# Patient Record
Sex: Male | Born: 1975 | Race: White | Hispanic: No | Marital: Single | Smoking: Current some day smoker
Health system: Southern US, Community
[De-identification: ages and names within clinical notes are randomized; demographics above are authoritative.]

## PROBLEM LIST (undated history)

## (undated) DIAGNOSIS — Z87442 Personal history of urinary calculi: Secondary | ICD-10-CM

## (undated) DIAGNOSIS — N135 Crossing vessel and stricture of ureter without hydronephrosis: Secondary | ICD-10-CM

## (undated) DIAGNOSIS — K219 Gastro-esophageal reflux disease without esophagitis: Secondary | ICD-10-CM

## (undated) DIAGNOSIS — N200 Calculus of kidney: Secondary | ICD-10-CM

## (undated) HISTORY — PX: HERNIA REPAIR: SHX51

---

## 2014-02-25 ENCOUNTER — Encounter (HOSPITAL_COMMUNITY): Payer: Self-pay | Admitting: Emergency Medicine

## 2014-02-25 ENCOUNTER — Emergency Department (HOSPITAL_COMMUNITY): Payer: Self-pay

## 2014-02-25 ENCOUNTER — Emergency Department (HOSPITAL_COMMUNITY)
Admission: EM | Admit: 2014-02-25 | Discharge: 2014-02-25 | Disposition: A | Discharge status: Home / Self Care | Payer: Self-pay | Attending: Emergency Medicine | Admitting: Emergency Medicine

## 2014-02-25 DIAGNOSIS — N200 Calculus of kidney: Secondary | ICD-10-CM | POA: Insufficient documentation

## 2014-02-25 DIAGNOSIS — Z9889 Other specified postprocedural states: Secondary | ICD-10-CM | POA: Insufficient documentation

## 2014-02-25 DIAGNOSIS — F17200 Nicotine dependence, unspecified, uncomplicated: Secondary | ICD-10-CM | POA: Insufficient documentation

## 2014-02-25 LAB — CBC WITH DIFFERENTIAL/PLATELET
BASOS PCT: 0 % (ref 0–1)
Basophils Absolute: 0.1 10*3/uL (ref 0.0–0.1)
Eosinophils Absolute: 0.2 10*3/uL (ref 0.0–0.7)
Eosinophils Relative: 1 % (ref 0–5)
HEMATOCRIT: 42.3 % (ref 39.0–52.0)
Hemoglobin: 15.1 g/dL (ref 13.0–17.0)
LYMPHS PCT: 12 % (ref 12–46)
Lymphs Abs: 1.9 10*3/uL (ref 0.7–4.0)
MCH: 31.5 pg (ref 26.0–34.0)
MCHC: 35.7 g/dL (ref 30.0–36.0)
MCV: 88.3 fL (ref 78.0–100.0)
MONO ABS: 0.9 10*3/uL (ref 0.1–1.0)
Monocytes Relative: 5 % (ref 3–12)
Neutro Abs: 13.5 10*3/uL — ABNORMAL HIGH (ref 1.7–7.7)
Neutrophils Relative %: 82 % — ABNORMAL HIGH (ref 43–77)
PLATELETS: 266 10*3/uL (ref 150–400)
RBC: 4.79 MIL/uL (ref 4.22–5.81)
RDW: 12.8 % (ref 11.5–15.5)
WBC: 16.6 10*3/uL — AB (ref 4.0–10.5)

## 2014-02-25 LAB — BASIC METABOLIC PANEL
BUN: 10 mg/dL (ref 6–23)
CALCIUM: 10 mg/dL (ref 8.4–10.5)
CO2: 23 mEq/L (ref 19–32)
CREATININE: 0.98 mg/dL (ref 0.50–1.35)
Chloride: 99 mEq/L (ref 96–112)
GFR calc non Af Amer: >90 mL/min (ref >90)
Glucose, Bld: 153 mg/dL — ABNORMAL HIGH (ref 70–99)
Potassium: 3.5 mEq/L — ABNORMAL LOW (ref 3.7–5.3)
SODIUM: 142 meq/L (ref 137–147)

## 2014-02-25 LAB — URINALYSIS, ROUTINE W REFLEX MICROSCOPIC
GLUCOSE, UA: NEGATIVE mg/dL
KETONES UR: NEGATIVE mg/dL
Nitrite: NEGATIVE
PROTEIN: 100 mg/dL — AB
Specific Gravity, Urine: 1.023 (ref 1.005–1.030)
UROBILINOGEN UA: 0.2 mg/dL (ref 0.0–1.0)
pH: 6 (ref 5.0–8.0)

## 2014-02-25 LAB — URINE MICROSCOPIC-ADD ON

## 2014-02-25 MED ORDER — HYDROMORPHONE HCL PF 1 MG/ML IJ SOLN
1.0000 mg | Freq: Once | INTRAMUSCULAR | Status: DC
  Filled 2014-02-25: qty 1

## 2014-02-25 MED ORDER — ONDANSETRON 8 MG PO TBDP: 8.0000 mg | ORAL_TABLET | Freq: Three times a day (TID) | ORAL | Status: DC | PRN

## 2014-02-25 MED ORDER — OXYCODONE-ACETAMINOPHEN 5-325 MG PO TABS: 2.0000 | ORAL_TABLET | ORAL | Status: DC | PRN

## 2014-02-25 MED ORDER — TAMSULOSIN HCL 0.4 MG PO CAPS: 0.4000 mg | ORAL_CAPSULE | Freq: Every day | ORAL | Status: DC

## 2014-02-25 MED ORDER — ONDANSETRON HCL 4 MG/2ML IJ SOLN
4.0000 mg | Freq: Once | INTRAMUSCULAR | Status: DC
  Filled 2014-02-25: qty 2

## 2014-02-25 NOTE — Discharge Instructions (Signed)
You have a large (1.0 cm) stone at the junction of your kidney and your ureter.  This will most likely require urology evaluation and treatment to pass.  You have a 3 mm stone still in the kidney, not causing problems at this time.    Take medications as prescribed.  Drink plenty of water.  Follow up with urology.  [facility] if you are having worsening condition or new concerning symptoms.  The urologist prefer to [facility] their patients [facility].     Kidney Stones Kidney stones (urolithiasis) are deposits that form inside your kidneys. The intense pain is caused by the stone moving through the urinary tract. When the stone moves, the ureter goes into spasm around the stone. The stone is usually passed in the urine.  CAUSES   A disorder that makes certain neck glands produce too much parathyroid hormone (primary hyperparathyroidism).  A buildup of uric acid crystals, similar to gout in your joints.  Narrowing (stricture) of the ureter.  A kidney obstruction present at birth (congenital obstruction).  Previous surgery on the kidney or ureters.  Numerous kidney infections. SYMPTOMS   Feeling sick to your stomach (nauseous).  Throwing up (vomiting).  Blood in the urine (hematuria).  Pain that usually spreads (radiates) to the groin.  Frequency or urgency of urination. DIAGNOSIS   Taking a history and physical exam.  Blood or urine tests.  CT scan.  Occasionally, an examination of the inside of the urinary bladder (cystoscopy) is performed. TREATMENT   Observation.  Increasing your fluid intake.  Extracorporeal shock wave lithotripsy This is a noninvasive procedure that uses shock waves to break up kidney stones.  Surgery may be needed if you have severe pain or persistent obstruction. There are various surgical procedures. Most of the procedures are performed with the use of small instruments. Only small incisions are needed to accommodate these  instruments, so recovery time is minimized. The size, location, and chemical composition are all important variables that will determine the proper choice of action for you. Talk to your health care provider to better understand your situation so that you will minimize the risk of injury to yourself and your kidney.  HOME CARE INSTRUCTIONS   Drink enough water and fluids to keep your urine clear or pale yellow. This will help you to pass the stone or stone fragments.  Strain all urine through the provided strainer. Keep all particulate matter and stones for your health care provider to see. The stone causing the pain may be as small as a grain of salt. It is very important to use the strainer each and every time you pass your urine. The collection of your stone will allow your health care provider to analyze it and verify that a stone has actually passed. The stone analysis will often identify what you can do to reduce the incidence of recurrences.  Only take over-the-counter or prescription medicines for pain, discomfort, or fever as directed by your health care provider.  Make a follow-up appointment with your health care provider as directed.  Get follow-up X-rays if required. The absence of pain does not always mean that the stone has passed. It may have only stopped moving. If the urine remains completely obstructed, it can cause loss of kidney function or even complete destruction of the kidney. It is your responsibility to make sure X-rays and follow-ups are completed. Ultrasounds of the kidney can show blockages and the status of the kidney. Ultrasounds  are not associated with any radiation and can be performed easily in a matter of minutes. SEEK MEDICAL CARE IF:  You experience pain that is progressive and unresponsive to any pain medicine you have been prescribed. SEEK IMMEDIATE MEDICAL CARE IF:   Pain cannot be controlled with the prescribed medicine.  You have a fever or shaking  chills.  The severity or intensity of pain increases over 18 hours and is not relieved by pain medicine.  You develop a new onset of abdominal pain.  You feel faint or pass out.  You are unable to urinate. MAKE SURE YOU:   Understand these instructions.  Will watch your condition.  Will get help right away if you are not doing well or get worse. Document Released: 10/15/2005 Document Revised: 06/17/2013 Document Reviewed: 03/18/2013 [facility] Patient Information 2014 [place].

## 2014-02-25 NOTE — ED Notes (Signed)
Pt is c/o right flank pain that initially started about 3 to 4 days ago and got worse tonight about 2am  Pt has had nausea and vomiting tonight

## 2014-02-25 NOTE — ED Provider Notes (Signed)
CSN: [id number]     Arrival date & time 02/25/14  0414 History   First MD Initiated Contact with Patient 02/25/14 [phone]     Chief Complaint  Patient presents with  . Flank Pain     (Consider location/radiation/quality/duration/timing/severity/associated sxs/prior Treatment) HPI 38 year old male presents to emergency room from home with complaint of right flank pain.  Patient reports onset of pain 3-4 days ago, tonight with severe worsening of pain with radiation down around his abdomen into his groin.  Patient has had nausea and vomiting with the pain.  Pain is sharp stabbing in nature severe.  Patient reports remote history of kidney stone 15 years ago, reports symptoms are similar to prior stone.  No fevers no chills.  Patient has history of hernia repair Past Medical History  Diagnosis Date  . Kidney stone    Past Surgical History  Procedure Laterality Date  . Hernia repair     Family History  Problem Relation Age of Onset  . Kidney Stones Mother   . Kidney Stones Father   . Diabetes Father   . Hypertension Father    History  Substance Use Topics  . Smoking status: Current Some Day Smoker    Types: Cigarettes  . Smokeless tobacco: Not on file  . Alcohol Use: No    Review of Systems  See History of Present Illness; otherwise all other systems are reviewed and negative   Allergies  Review of patient's allergies indicates no known allergies.  Home Medications   Prior to Admission medications   Medication Sig Start Date End Date Taking? Authorizing Provider  naproxen sodium (ANAPROX) 220 MG tablet Take 440 mg by mouth 2 (two) times daily as needed (pain).   Yes Historical Provider, MD   BP 129/81  Pulse 62  Temp(Src) 97.6 F (36.4 C) (Oral)  Resp 19  Ht 5\' 9"  (1.753 m)  Wt 180 lb (81.647 kg)  BMI 26.57 kg/m2  SpO2 100% Physical Exam  Nursing note and vitals reviewed. Constitutional: He is oriented to person, place, and time. He appears well-developed and  well-nourished. He appears distressed.  HENT:  Head: Normocephalic and atraumatic.  Nose: Nose normal.  Mouth/Throat: Oropharynx is clear and moist.  Eyes: Conjunctivae and EOM are normal. Pupils are equal, round, and reactive to light.  Neck: Normal range of motion. Neck supple. No JVD present. No tracheal deviation present. No thyromegaly present.  Cardiovascular: Normal rate, regular rhythm, normal heart sounds and intact distal pulses.  Exam reveals no gallop and no friction rub.   No murmur heard. Pulmonary/Chest: Effort normal and breath sounds normal. No stridor. No respiratory distress. He has no wheezes. He has no rales. He exhibits no tenderness.  Abdominal: Soft. Bowel sounds are normal. He exhibits no distension and no mass. There is tenderness (right CVA tenderness). There is no rebound and no guarding.  Musculoskeletal: Normal range of motion. He exhibits no edema and no tenderness.  Lymphadenopathy:    He has no cervical adenopathy.  Neurological: He is alert and oriented to person, place, and time. He exhibits normal muscle tone. Coordination normal.  Skin: Skin is warm and dry. No rash noted. No erythema. No pallor.  Psychiatric: He has a normal mood and affect. His behavior is normal. Judgment and thought content normal.    ED Course  Procedures (including critical care time) Labs Review Labs Reviewed  CBC WITH DIFFERENTIAL - Abnormal; Notable for the following:    WBC 16.6 (*)    Neutrophils  Relative % 82 (*)    Neutro Abs 13.5 (*)    All other components within normal limits  BASIC METABOLIC PANEL - Abnormal; Notable for the following:    Potassium 3.5 (*)    Glucose, Bld 153 (*)    All other components within normal limits  URINALYSIS, ROUTINE W REFLEX MICROSCOPIC    Imaging Review Ct Abdomen Pelvis Wo Contrast  02/25/2014   CLINICAL DATA:  Right flank pain  EXAM: CT ABDOMEN AND PELVIS WITHOUT CONTRAST  TECHNIQUE: Multidetector CT imaging of the abdomen and  pelvis was performed following the standard protocol without intravenous contrast.  COMPARISON:  None.  FINDINGS: The visualized lung bases are clear.  The liver demonstrates a normal unenhanced appearance. Gallbladder is within normal limits. No biliary ductal dilatation. The spleen, adrenal glands, and pancreas demonstrate a normal unenhanced appearance.  AA 1 cm partially obstructive stone present at the right UPJ (series 4, image 74). There is fullness of the right renal with trace hydronephrosis in the mid and lower right kidney. Inflammatory fat stranding seen about the proximal right ureter. There is asymmetric prominence of the right ureter distally without additional ureteral stone. There is an additional nonobstructive 3 mm stone within the lower pole of the right kidney.  No evidence of bowel obstruction. Appendix is well visualized in the right lower quadrant and is of normal caliber and appearance without associated inflammatory changes to suggest acute appendicitis. No inflammatory changes seen about the bowels.  Bladder and prostate are normal.  No free air or fluid. No enlarged intra-abdominal pelvic lymph nodes.  No acute osseous abnormality. No worrisome lytic or blastic osseous lesions. Bone island noted within the right femoral neck.  IMPRESSION: 1. 1 cm stone at the right ureteropelvic junction with associated inflammatory fat stranding about the right renal pelvis and proximal right ureter. There is secondary trace hydronephrosis within the mid and lower right kidney. 2. Additional 3 mm nonobstructive stone within the lower pole the right kidney. 3. No other acute intra-abdominal or pelvic process. Normal appendix.   Electronically Signed   By: Rise MuBenjamin  McClintock M.D.   On: 02/25/2014 05:41     EKG Interpretation None      MDM   Final diagnoses:  Kidney stone on right side    38 year old male with right flank pain, suspect renal colic.  Patient to receive IV pain and nausea  medication, plan for CT abdomen pelvis, basic labs.  6:27 AM Pt reports resolution of pain, nausea without medications.  Large stone at UPJ noted on CT scan.  Pt to f/u with urology.   Olivia Mackielga M Zahirah Cheslock, MD 02/25/14 [phone]

## 2014-02-25 NOTE — ED Notes (Signed)
Went in to start IV and give pain medication  Pt states he feels 100% better and the pain is gone  Pt's color has improved  Pt talkative

## 2014-02-25 NOTE — ED Notes (Signed)
Pt complains of generalized abd pain for about one week, tonight he complains of severe right sided abd pain, nausea no vomiting

## 2014-03-02 ENCOUNTER — Other Ambulatory Visit: Payer: Self-pay | Admitting: Urology

## 2014-03-11 ENCOUNTER — Encounter (HOSPITAL_BASED_OUTPATIENT_CLINIC_OR_DEPARTMENT_OTHER): Payer: Self-pay | Admitting: *Deleted

## 2014-03-15 ENCOUNTER — Encounter (HOSPITAL_BASED_OUTPATIENT_CLINIC_OR_DEPARTMENT_OTHER): Payer: Self-pay | Admitting: *Deleted

## 2014-03-15 NOTE — Progress Notes (Signed)
NPO AFTER MN WITH EXCEPTION WATER/ GATORADE UNTIL 0700.  ARRIVE AT 1145. CURRENT LAB RESULTS IN CHART AND EPIC. MAY TAKE PAIN/ NAUSEA RX IF NEEDED AM DOS W/ SIPS OF WATER.

## 2014-03-17 ENCOUNTER — Ambulatory Visit (HOSPITAL_BASED_OUTPATIENT_CLINIC_OR_DEPARTMENT_OTHER): Payer: Self-pay | Admitting: Anesthesiology

## 2014-03-17 ENCOUNTER — Ambulatory Visit (HOSPITAL_BASED_OUTPATIENT_CLINIC_OR_DEPARTMENT_OTHER)
Admission: RE | Admit: 2014-03-17 | Discharge: 2014-03-17 | Disposition: A | Discharge status: Home / Self Care | Payer: Self-pay | Source: Ambulatory Visit | Attending: Urology | Admitting: Urology

## 2014-03-17 ENCOUNTER — Encounter (HOSPITAL_BASED_OUTPATIENT_CLINIC_OR_DEPARTMENT_OTHER): Payer: Self-pay | Admitting: *Deleted

## 2014-03-17 ENCOUNTER — Encounter (HOSPITAL_BASED_OUTPATIENT_CLINIC_OR_DEPARTMENT_OTHER)
Admission: RE | Disposition: A | Discharge status: Home / Self Care | Payer: Self-pay | Source: Ambulatory Visit | Attending: Urology

## 2014-03-17 ENCOUNTER — Encounter (HOSPITAL_BASED_OUTPATIENT_CLINIC_OR_DEPARTMENT_OTHER): Payer: Self-pay | Admitting: Anesthesiology

## 2014-03-17 DIAGNOSIS — N135 Crossing vessel and stricture of ureter without hydronephrosis: Secondary | ICD-10-CM | POA: Insufficient documentation

## 2014-03-17 DIAGNOSIS — K219 Gastro-esophageal reflux disease without esophagitis: Secondary | ICD-10-CM | POA: Insufficient documentation

## 2014-03-17 DIAGNOSIS — N200 Calculus of kidney: Secondary | ICD-10-CM | POA: Insufficient documentation

## 2014-03-17 DIAGNOSIS — F17200 Nicotine dependence, unspecified, uncomplicated: Secondary | ICD-10-CM | POA: Insufficient documentation

## 2014-03-17 HISTORY — DX: Gastro-esophageal reflux disease without esophagitis: K21.9

## 2014-03-17 HISTORY — DX: Calculus of kidney: N20.0

## 2014-03-17 HISTORY — DX: Personal history of urinary calculi: Z87.442

## 2014-03-17 HISTORY — PX: CYSTOSCOPY WITH RETROGRADE PYELOGRAM, URETEROSCOPY AND STENT PLACEMENT: SHX5789

## 2014-03-17 SURGERY — CYSTOURETEROSCOPY, WITH RETROGRADE PYELOGRAM AND STENT INSERTION
Anesthesia: General | Site: Ureter | Laterality: Right

## 2014-03-17 MED ORDER — CEFAZOLIN SODIUM-DEXTROSE 2-3 GM-% IV SOLR
2.0000 g | INTRAVENOUS | Status: AC
  Administered 2014-03-17: 2 g via INTRAVENOUS
  Filled 2014-03-17: qty 50

## 2014-03-17 MED ORDER — LIDOCAINE HCL (CARDIAC) 20 MG/ML IV SOLN
INTRAVENOUS | Status: DC | PRN
  Administered 2014-03-17: 100 mg via INTRAVENOUS

## 2014-03-17 MED ORDER — FENTANYL CITRATE 0.05 MG/ML IJ SOLN
INTRAMUSCULAR | Status: DC | PRN
  Administered 2014-03-17 (×4): 50 ug via INTRAVENOUS

## 2014-03-17 MED ORDER — SODIUM CHLORIDE 0.9 % IR SOLN
Status: DC | PRN
  Administered 2014-03-17: 4000 mL

## 2014-03-17 MED ORDER — METOCLOPRAMIDE HCL 5 MG/ML IJ SOLN
INTRAMUSCULAR | Status: DC | PRN
  Administered 2014-03-17: 10 mg via INTRAVENOUS

## 2014-03-17 MED ORDER — OXYBUTYNIN CHLORIDE 5 MG PO TABS
5.0000 mg | ORAL_TABLET | Freq: Four times a day (QID) | ORAL | Status: DC | PRN
  Administered 2014-03-17: 5 mg via ORAL
  Filled 2014-03-17: qty 1

## 2014-03-17 MED ORDER — CEPHALEXIN 500 MG PO CAPS: 500.0000 mg | ORAL_CAPSULE | Freq: Three times a day (TID) | ORAL | Status: DC

## 2014-03-17 MED ORDER — PHENAZOPYRIDINE HCL 200 MG PO TABS: 200.0000 mg | ORAL_TABLET | Freq: Three times a day (TID) | ORAL | Status: DC | PRN

## 2014-03-17 MED ORDER — PHENAZOPYRIDINE HCL 200 MG PO TABS
200.0000 mg | ORAL_TABLET | Freq: Three times a day (TID) | ORAL | Status: DC
  Administered 2014-03-17: 200 mg via ORAL
  Filled 2014-03-17: qty 1

## 2014-03-17 MED ORDER — KETOROLAC TROMETHAMINE 30 MG/ML IJ SOLN
INTRAMUSCULAR | Status: DC | PRN
  Administered 2014-03-17: 30 mg via INTRAVENOUS

## 2014-03-17 MED ORDER — LACTATED RINGERS IV SOLN
INTRAVENOUS | Status: DC
  Administered 2014-03-17: 12:00:00 via INTRAVENOUS
  Filled 2014-03-17: qty 1000

## 2014-03-17 MED ORDER — OXYCODONE-ACETAMINOPHEN 5-325 MG PO TABS: 1.0000 | ORAL_TABLET | ORAL | Status: DC | PRN

## 2014-03-17 MED ORDER — LIDOCAINE HCL 2 % EX GEL
CUTANEOUS | Status: DC | PRN
  Administered 2014-03-17: 1 via URETHRAL

## 2014-03-17 MED ORDER — ONDANSETRON HCL 4 MG/2ML IJ SOLN
INTRAMUSCULAR | Status: DC | PRN
  Administered 2014-03-17: 4 mg via INTRAVENOUS

## 2014-03-17 MED ORDER — MIDAZOLAM HCL 2 MG/2ML IJ SOLN
INTRAMUSCULAR | Status: AC
  Filled 2014-03-17: qty 2

## 2014-03-17 MED ORDER — PHENAZOPYRIDINE HCL 100 MG PO TABS
ORAL_TABLET | ORAL | Status: AC
  Filled 2014-03-17: qty 2

## 2014-03-17 MED ORDER — ACETAMINOPHEN 10 MG/ML IV SOLN
INTRAVENOUS | Status: DC | PRN
  Administered 2014-03-17: 1000 mg via INTRAVENOUS

## 2014-03-17 MED ORDER — OXYCODONE-ACETAMINOPHEN 5-325 MG PO TABS
ORAL_TABLET | ORAL | Status: AC
  Filled 2014-03-17: qty 1

## 2014-03-17 MED ORDER — PROPOFOL 10 MG/ML IV BOLUS
INTRAVENOUS | Status: DC | PRN
  Administered 2014-03-17: 200 mg via INTRAVENOUS

## 2014-03-17 MED ORDER — PROMETHAZINE HCL 25 MG/ML IJ SOLN
6.2500 mg | INTRAMUSCULAR | Status: DC | PRN
  Filled 2014-03-17: qty 1

## 2014-03-17 MED ORDER — OXYCODONE-ACETAMINOPHEN 5-325 MG PO TABS
1.0000 | ORAL_TABLET | ORAL | Status: DC | PRN
  Administered 2014-03-17: 1 via ORAL
  Filled 2014-03-17: qty 2

## 2014-03-17 MED ORDER — SENNOSIDES-DOCUSATE SODIUM 8.6-50 MG PO TABS: 1.0000 | ORAL_TABLET | Freq: Two times a day (BID) | ORAL | Status: DC

## 2014-03-17 MED ORDER — GLYCOPYRROLATE 0.2 MG/ML IJ SOLN
INTRAMUSCULAR | Status: DC | PRN
  Administered 2014-03-17: 0.2 mg via INTRAVENOUS

## 2014-03-17 MED ORDER — OXYBUTYNIN CHLORIDE 5 MG PO TABS
ORAL_TABLET | ORAL | Status: AC
  Filled 2014-03-17: qty 1

## 2014-03-17 MED ORDER — OXYBUTYNIN CHLORIDE 5 MG PO TABS: 5.0000 mg | ORAL_TABLET | Freq: Four times a day (QID) | ORAL | Status: DC | PRN

## 2014-03-17 MED ORDER — BELLADONNA ALKALOIDS-OPIUM 16.2-60 MG RE SUPP
RECTAL | Status: AC
  Filled 2014-03-17: qty 1

## 2014-03-17 MED ORDER — BELLADONNA ALKALOIDS-OPIUM 16.2-60 MG RE SUPP
RECTAL | Status: DC | PRN
  Administered 2014-03-17: 1 via RECTAL

## 2014-03-17 MED ORDER — IOHEXOL 350 MG/ML SOLN
INTRAVENOUS | Status: DC | PRN
  Administered 2014-03-17: 5 mL via URETHRAL

## 2014-03-17 MED ORDER — FENTANYL CITRATE 0.05 MG/ML IJ SOLN
25.0000 ug | INTRAMUSCULAR | Status: DC | PRN
  Filled 2014-03-17: qty 1

## 2014-03-17 MED ORDER — FENTANYL CITRATE 0.05 MG/ML IJ SOLN
INTRAMUSCULAR | Status: AC
  Filled 2014-03-17: qty 4

## 2014-03-17 MED ORDER — DEXAMETHASONE SODIUM PHOSPHATE 4 MG/ML IJ SOLN
INTRAMUSCULAR | Status: DC | PRN
  Administered 2014-03-17: 10 mg via INTRAVENOUS

## 2014-03-17 MED ORDER — MIDAZOLAM HCL 5 MG/5ML IJ SOLN
INTRAMUSCULAR | Status: DC | PRN
  Administered 2014-03-17: 2 mg via INTRAVENOUS

## 2014-03-17 SURGICAL SUPPLY — 41 items
BAG DRAIN URO-CYSTO SKYTR STRL (DRAIN) ×4 IMPLANT
BASKET LASER NITINOL 1.9FR (BASKET) IMPLANT
BASKET STNLS GEMINI 4WIRE 3FR (BASKET) IMPLANT
BASKET ZERO TIP NITINOL 2.4FR (BASKET) IMPLANT
CANISTER SUCT LVC 12 LTR MEDI- (MISCELLANEOUS) ×4 IMPLANT
CATH CLEAR GEL 3F BACKSTOP (CATHETERS) IMPLANT
CATH INTERMIT  6FR 70CM (CATHETERS) IMPLANT
CATH URET 5FR 28IN CONE TIP (BALLOONS)
CATH URET 5FR 28IN OPEN ENDED (CATHETERS) ×4 IMPLANT
CATH URET 5FR 70CM CONE TIP (BALLOONS) IMPLANT
CATH URET DUAL LUMEN 6-10FR 50 (CATHETERS) IMPLANT
CLOTH BEACON ORANGE TIMEOUT ST (SAFETY) ×4 IMPLANT
DRAPE CAMERA CLOSED 9X96 (DRAPES) ×4 IMPLANT
ELECT REM PT RETURN 9FT ADLT (ELECTROSURGICAL)
ELECTRODE REM PT RTRN 9FT ADLT (ELECTROSURGICAL) IMPLANT
FIBER LASER FLEXIVA 200 (UROLOGICAL SUPPLIES) IMPLANT
FIBER LASER FLEXIVA 365 (UROLOGICAL SUPPLIES) IMPLANT
GLOVE BIO SURGEON STRL SZ7 (GLOVE) ×4 IMPLANT
GLOVE BIOGEL M 6.5 STRL (GLOVE) ×4 IMPLANT
GLOVE BIOGEL PI IND STRL 6.5 (GLOVE) ×2 IMPLANT
GLOVE BIOGEL PI INDICATOR 6.5 (GLOVE) ×2
GLOVE ECLIPSE 7.0 STRL STRAW (GLOVE) ×4 IMPLANT
GLOVE INDICATOR 7.5 STRL GRN (GLOVE) ×4 IMPLANT
GOWN PREVENTION PLUS LG XLONG (DISPOSABLE) ×4 IMPLANT
GOWN STRL REUS W/TWL LRG LVL3 (GOWN DISPOSABLE) ×4 IMPLANT
GOWN STRL REUS W/TWL XL LVL3 (GOWN DISPOSABLE) ×4 IMPLANT
GUIDEWIRE 0.038 PTFE COATED (WIRE) IMPLANT
GUIDEWIRE ANG ZIPWIRE 038X150 (WIRE) IMPLANT
GUIDEWIRE STR DUAL SENSOR (WIRE) ×8 IMPLANT
IV NS IRRIG 3000ML ARTHROMATIC (IV SOLUTION) ×4 IMPLANT
KIT BALLIN UROMAX 15FX10 (LABEL) IMPLANT
KIT BALLN UROMAX 15FX4 (MISCELLANEOUS) IMPLANT
KIT BALLN UROMAX 26 75X4 (MISCELLANEOUS)
PACK CYSTOSCOPY (CUSTOM PROCEDURE TRAY) ×4 IMPLANT
SET HIGH PRES BAL DIL (LABEL)
SHEATH ACCESS URETERAL 38CM (SHEATH) IMPLANT
SHEATH ACCESS URETERAL 54CM (SHEATH) ×4 IMPLANT
SHEATH URET ACCESS 12FR/35CM (UROLOGICAL SUPPLIES) IMPLANT
SHEATH URET ACCESS 12FR/55CM (UROLOGICAL SUPPLIES) IMPLANT
STENT POLARIS LOOP 6FR X 24 CM (STENTS) ×4 IMPLANT
SYRINGE IRR TOOMEY STRL 70CC (SYRINGE) IMPLANT

## 2014-03-17 NOTE — Discharge Instructions (Signed)
DISCHARGE INSTRUCTIONS FOR KIDNEY STONES OR URETERAL STENT ° °MEDICATIONS:  ° °1.Resume all your other meds from home. ° °ACTIVITY °1. No strenuous activity x 1week °2. No driving while on narcotic pain medications °3. Drink plenty of water °4. Continue to walk at home - you can still get blood clots when you are at home, so keep active, but don't over do it. °5. May return to work in 3 days. ° °BATHING °1. You can shower or take a bath. °SIGNS/SYMPTOMS TO CALL: °1. Please call us if you have a fever greater than 101.5, uncontrolled  °nausea/vomiting, uncontrolled pain, dizziness, unable to urinate, chest pain, shortness of breath, leg swelling, leg pain, redness around wound, drainage from wound, or any other concerns or questions. ° °You can reach us at [phone]. ° °[facility] °[phone] °Post Ureteroscopy With or Without Stent Instructions ° °Definitions: ° °Ureter: The duct that transports urine from the kidney to the bladder. °Stent:   A plastic hollow tube that is placed into the ureter, from the kidney to the                 bladder to prevent the ureter from swelling shut. ° °GENERAL INSTRUCTIONS: ° °Despite the fact that no skin incisions were used, the area around the ureter and bladder is raw and irritated. The stent is a foreign body which will further irritate the bladder wall. This irritation is manifested by increased frequency of urination, both day and night, and by an increase in the urge to urinate. In some, the urge to urinate is present almost always. Sometimes the urge is strong enough that you may not be able to stop yourself from urinating. The only real cure is to remove the stent and then give time for the bladder wall to heal which can't be done until the danger of the ureter swelling shut has passed, which varies. ° °You may see some blood in your urine while the stent is in place and a few days afterwards. Do not be alarmed, even if the urine was clear for a  while. Get off your feet and drink lots of fluids until clearing occurs. If you start to pass clots or don't improve, call us. ° °DIET: °You may return to your normal diet immediately. Because of the raw surface of your bladder, alcohol, spicy foods, acid type foods and drinks with caffeine may cause irritation or frequency and should be used in moderation. To keep your urine flowing freely and to avoid constipation, drink plenty of fluids during the day ( 8-10 glasses ). °Tip: Avoid cranberry juice because it is very acidic. ° °ACTIVITY: °Your physical activity doesn't need to be restricted. However, if you are very active, you may see some blood in your urine. We suggest that you reduce your activity under these circumstances until the bleeding has stopped. ° °BOWELS: °It is important to keep your bowels regular during the postoperative period. Straining with bowel movements can cause bleeding. A bowel movement every other day is reasonable. Use a mild laxative if needed, such as Milk of Magnesia 2-3 tablespoons, or 2 Dulcolax tablets. Call if you continue to have problems. If you have been taking narcotics for pain, before, during or after your surgery, you may be constipated. Take a laxative if necessary. ° °MEDICATION: °You should resume your pre-surgery medications unless told not to. In addition you will often be given an antibiotic to prevent infection. These should be taken as prescribed until the bottles   are finished unless you are having an unusual reaction to one of the drugs. ° °PROBLEMS YOU SHOULD REPORT TO US: °· Fevers over 100.5 Fahrenheit. °· Heavy bleeding, or clots ( See above notes about blood in urine ). °· Inability to urinate. °· Drug reactions ( hives, rash, nausea, vomiting, diarrhea ). °· Severe burning or pain with urination that is not improving. ° °FOLLOW-UP: °You will need a follow-up appointment to monitor your progress. Call for this appointment at the number listed above. Usually  the first appointment will be about three to fourteen days after your surgery. ° ° °Post Anesthesia Home Care Instructions ° °Activity: °Get plenty of rest for the remainder of the day. A responsible adult should stay with you for 24 hours following the procedure.  °For the next 24 hours, DO NOT: °-Drive a car °-Operate machinery °-Drink alcoholic beverages °-Take any medication unless instructed by your physician °-Make any legal decisions or sign important papers. ° °Meals: °Start with liquid foods such as gelatin or soup. Progress to regular foods as tolerated. Avoid greasy, spicy, heavy foods. If nausea and/or vomiting occur, drink only clear liquids until the nausea and/or vomiting subsides. Call your physician if vomiting continues. ° °Special Instructions/Symptoms: °Your throat may feel dry or sore from the anesthesia or the breathing tube placed in your throat during surgery. If this causes discomfort, gargle with warm salt water. The discomfort should disappear within 24 hours. ° °

## 2014-03-17 NOTE — H&P (Signed)
Urology History and Physical Exam  CC: Right nephrolithiasis.  HPI:  38 year old male presents today for right-sided nephrolithiasis.  This was associated with a right renal pelvis stone.  Size is 1.2 cm.  He also has a right lower pole stone which is 3 mm.  These are associated with flank pain.  Nothing makes it better or worse.  It is intermittent in nature.  After reviewing treatment options he presents today for cystoscopy, right ureteroscopy, right retrograde pyelogram, laser lithotripsy, and possible right ureter stent placement.  Urine culture 03/02/14 showed 3000 colonies of insignificant growth.  PMH: Past Medical History  Diagnosis Date  . Right ureteral stone   . Right nephrolithiasis   . History of kidney stones   . Acid reflux     WATCHES DIET    PSH: Past Surgical History  Procedure Laterality Date  . Hernia repair  AGE 38    ABDOMINAL    Allergies: No Known Allergies  Medications: No prescriptions prior to admission     Social History: History   Social History  . Marital Status: Single    Spouse Name: N/A    Number of Children: N/A  . Years of Education: N/A   Occupational History  . Not on file.   Social History Main Topics  . Smoking status: Current Some Day Smoker -- 16 years    Types: Cigarettes  . Smokeless tobacco: Never Used     Comment: SOCIAL SMOKER  . Alcohol Use: No  . Drug Use: No  . Sexual Activity: Not on file   Other Topics Concern  . Not on file   Social History Narrative  . No narrative on file    Family History: Family History  Problem Relation Age of Onset  . Kidney Stones Mother   . Kidney Stones Father   . Diabetes Father   . Hypertension Father     Review of Systems: Positive: Right flank pain. Negative: Fever, SOB, or chest pain.  A further 10 point review of systems was negative except what is listed in the HPI.  Physical Exam: Filed Vitals:   03/17/14 1129  BP: 130/88  Pulse: 90  Temp: 97.9 F (36.6  C)  Resp: 16    General: No acute distress.  Awake. Head:  Normocephalic.  Atraumatic. ENT:  EOMI.  Mucous membranes moist Neck:  Supple.  No lymphadenopathy. CV:  S1 present. S2 present. Regular rate. Pulmonary: Equal effort bilaterally.  Clear to auscultation bilaterally. Abdomen: Soft.  Non- tender to palpation. Skin:  Normal turgor.  No visible rash. Extremity: No gross deformity of bilateral upper extremities.  No gross deformity of    bilateral lower extremities. Neurologic: Alert. Appropriate mood.    Studies:  No results found for this basename: HGB, WBC, PLT,  in the last 72 hours  No results found for this basename: NA, K, CL, CO2, BUN, CREATININE, CALCIUM, MAGNESIUM, GFRNONAA, GFRAA,  in the last 72 hours   No results found for this basename: PT, INR, APTT,  in the last 72 hours   No components found with this basename: ABG,     Assessment:  Right nephrolithiasis.  Plan: To OR for cystoscopy, right ureteroscopy, right retrograde pyelogram, laser lithotripsy, and possible right ureter stent placement.

## 2014-03-17 NOTE — Anesthesia Procedure Notes (Signed)
Procedure Name: LMA Insertion Date/Time: 03/17/2014 1:33 PM Performed by: Norva PavlovALLAWAY, Maisie Hauser G Pre-anesthesia Checklist: Patient identified, Emergency Drugs available, Suction available and Patient being monitored Patient Re-evaluated:Patient Re-evaluated prior to inductionOxygen Delivery Method: Circle System Utilized Preoxygenation: Pre-oxygenation with 100% oxygen Intubation Type: IV induction Ventilation: Mask ventilation without difficulty LMA: LMA with gastric port inserted LMA Size: 5.0 Number of attempts: 1 Placement Confirmation: positive ETCO2 Tube secured with: Tape Dental Injury: Teeth and Oropharynx as per pre-operative assessment

## 2014-03-17 NOTE — Anesthesia Postprocedure Evaluation (Signed)
  Anesthesia Post-op Note  Patient: Roberto Herrera  Procedure(s) Performed: Procedure(s) (LRB): CYSTOSCOPY WITH RETROGRADE PYELOGRAM,  AND STENT PLACEMENT (Right)  Patient Location: PACU  Anesthesia Type: General  Level of Consciousness: awake and alert   Airway and Oxygen Therapy: Patient Spontanous Breathing  Post-op Pain: mild  Post-op Assessment: Post-op Vital signs reviewed, Patient's Cardiovascular Status Stable, Respiratory Function Stable, Patent Airway and No signs of Nausea or vomiting  Last Vitals:  Filed Vitals:   03/17/14 1525  BP: 120/87  Pulse: 90  Temp: 36.6 C  Resp: 18    Post-op Vital Signs: stable   Complications: No apparent anesthesia complications

## 2014-03-17 NOTE — Op Note (Signed)
Urology Operative Report  Date of Procedure: 03/17/14  Surgeon: Natalia Leatherwoodaniel Temple Ewart, MD Assistant:  None  Preoperative Diagnosis: Right nephrolithiasis. Postoperative Diagnosis: Right nephrolithiasis. Right ureter stenosis.  Procedure(s): Cystoscopy. Right retrograde pyelogram. Right ureter stent placement (6x24 polaris without tether).  Estimated blood loss: Minimal  Specimen: None  Drains: None  Complications: None  Findings: Right ureter stenosis. Right renal pelvis filling defect- likely stone.  History of present illness: 38 year old male presents today with a large right ureter pelvic junction stone and a smaller kidney stone. He elected to proceed with ureteroscopy.   Procedure in detail: After informed consent was obtained, the patient was taken to the operating room. They were placed in the supine position. SCDs were turned on and in place. IV antibiotics were infused, and general anesthesia was induced. A timeout was performed in which the correct patient, surgical site, and procedure were identified and agreed upon by the team.  The patient was placed in a dorsolithotomy position, making sure to pad all pertinent neurovascular pressure points. A belladonna and opium suppository was placed into the rectum. The genitals were prepped and draped in the usual sterile fashion.  A rigid cystoscope was advanced through the urethra and into the bladder. The bladder was drained and then fully evaluated in a systematic fashion with a 12 and 70 lens to visualize the entire surface of the bladder. This was negative for tumors.  Attention was turned to the right ureter orifice. I cannulated this with a 5 [place] ureter catheter and injected 5 cc of Omnipaque to obtain a retrograde pyelogram. This showed a filling defect at the ureteropelvic junction consistent with a stone. There was mild amount of hydronephrosis. This side did not drain well.  I placed 2 sensor wires to the  cystoscope and up the right ureter orifice and into the right renal pelvis on fluoroscopy. One wire was secured as a safety wire while the other was secured as a working wire.  I then attempted to place a flexible digital ureter scope over the working wire under fluoroscopy, but this was not successful due to ureter stenosis.  I then attempted to place the obturator to 12-14 ureter access sheath under fluoroscopy over the wire, but this was also not successful.  Because of this distal ureter stenosis I elected to leave a stent in place and return in an interval fashion.  The safety wire was placed through the cystoscope and a 6 x 24 Polaris stent was placed through the cystoscope over the wire under fluoroscopy with ease. This was placed with a curl in the right renal pelvis and the loops within the bladder. The tether was removed.  The bladder was drained and the cystoscope was removed. I placed 10 cc lidocaine jelly into the urethra.  This completed the procedure, anesthesia was reversed, he was placed in a supine position, and [facility].  All counts were correct at the end of the case.  He'll be given several days of Keflex to cover for this mentation. He'll be scheduled for interval ureteroscopy.

## 2014-03-17 NOTE — Anesthesia Preprocedure Evaluation (Signed)
Anesthesia Evaluation  Patient identified by MRN, date of birth, ID band Patient awake    Reviewed: Allergy & Precautions, H&P , NPO status , Patient's Chart, lab work & pertinent test results  Airway Mallampati: II TM Distance: >3 FB Neck ROM: Full    Dental no notable dental hx.    Pulmonary Current Smoker,  breath sounds clear to auscultation  Pulmonary exam normal       Cardiovascular Exercise Tolerance: Good negative cardio ROS  Rhythm:Regular Rate:Normal     Neuro/Psych negative neurological ROS  negative psych ROS   GI/Hepatic Neg liver ROS, GERD-  ,  Endo/Other  negative endocrine ROS  Renal/GU Renal disease  negative genitourinary   Musculoskeletal negative musculoskeletal ROS (+)   Abdominal   Peds negative pediatric ROS (+)  Hematology negative hematology ROS (+)   Anesthesia Other Findings   Reproductive/Obstetrics negative OB ROS                           Anesthesia Physical Anesthesia Plan  ASA: II  Anesthesia Plan: General   Post-op Pain Management:    Induction: Intravenous  Airway Management Planned: LMA  Additional Equipment:   Intra-op Plan:   Post-operative Plan: Extubation in OR  Informed Consent: I have reviewed the patients History and Physical, chart, labs and discussed the procedure including the risks, benefits and alternatives for the proposed anesthesia with the patient or authorized representative who has indicated his/her understanding and acceptance.   Dental advisory given  Plan Discussed with: CRNA  Anesthesia Plan Comments:         Anesthesia Quick Evaluation

## 2014-03-17 NOTE — Progress Notes (Signed)
Reported to Dr. Margarita GrizzleWoodruff voided 50 ml red colored urine, okay to be discharged home.

## 2014-03-17 NOTE — Transfer of Care (Signed)
Immediate Anesthesia Transfer of Care Note  Patient: Roberto Herrera  Procedure(s) Performed: Procedure(s) (LRB): CYSTOSCOPY WITH RETROGRADE PYELOGRAM,  AND STENT PLACEMENT (Right)  Patient Location: PACU  Anesthesia Type: General  Level of Consciousness: awake, alert  and oriented  Airway & Oxygen Therapy: Patient Spontanous Breathing and Patient connected to nasal cannula oxygen  Post-op Assessment: Report given to PACU RN and Post -op Vital signs reviewed and stable  Post vital signs: Reviewed and stable  Complications: No apparent anesthesia complications

## 2014-03-18 ENCOUNTER — Encounter (HOSPITAL_BASED_OUTPATIENT_CLINIC_OR_DEPARTMENT_OTHER): Payer: Self-pay | Admitting: Urology

## 2014-03-21 ENCOUNTER — Emergency Department (HOSPITAL_COMMUNITY): Payer: Self-pay

## 2014-03-21 ENCOUNTER — Encounter (HOSPITAL_COMMUNITY): Payer: Self-pay | Admitting: Emergency Medicine

## 2014-03-21 ENCOUNTER — Emergency Department (HOSPITAL_COMMUNITY)
Admission: EM | Admit: 2014-03-21 | Discharge: 2014-03-21 | Disposition: A | Discharge status: Home / Self Care | Payer: Self-pay | Attending: Emergency Medicine | Admitting: Emergency Medicine

## 2014-03-21 DIAGNOSIS — N201 Calculus of ureter: Secondary | ICD-10-CM

## 2014-03-21 DIAGNOSIS — R109 Unspecified abdominal pain: Secondary | ICD-10-CM

## 2014-03-21 DIAGNOSIS — F17200 Nicotine dependence, unspecified, uncomplicated: Secondary | ICD-10-CM | POA: Insufficient documentation

## 2014-03-21 DIAGNOSIS — Z9889 Other specified postprocedural states: Secondary | ICD-10-CM | POA: Insufficient documentation

## 2014-03-21 DIAGNOSIS — Z792 Long term (current) use of antibiotics: Secondary | ICD-10-CM | POA: Insufficient documentation

## 2014-03-21 DIAGNOSIS — Z79899 Other long term (current) drug therapy: Secondary | ICD-10-CM | POA: Insufficient documentation

## 2014-03-21 DIAGNOSIS — Z8719 Personal history of other diseases of the digestive system: Secondary | ICD-10-CM | POA: Insufficient documentation

## 2014-03-21 DIAGNOSIS — N200 Calculus of kidney: Secondary | ICD-10-CM | POA: Insufficient documentation

## 2014-03-21 LAB — BASIC METABOLIC PANEL
BUN: 18 mg/dL (ref 6–23)
CHLORIDE: 96 meq/L (ref 96–112)
CO2: 21 meq/L (ref 19–32)
Calcium: 10.5 mg/dL (ref 8.4–10.5)
Creatinine, Ser: 1.2 mg/dL (ref 0.50–1.35)
GFR calc non Af Amer: 76 mL/min — ABNORMAL LOW (ref >90)
GFR, EST AFRICAN AMERICAN: 88 mL/min — AB (ref >90)
Glucose, Bld: 121 mg/dL — ABNORMAL HIGH (ref 70–99)
POTASSIUM: 3.7 meq/L (ref 3.7–5.3)
Sodium: 137 mEq/L (ref 137–147)

## 2014-03-21 LAB — CBC WITH DIFFERENTIAL/PLATELET
BASOS PCT: 1 % (ref 0–1)
Basophils Absolute: 0.1 10*3/uL (ref 0.0–0.1)
Eosinophils Absolute: 0.4 10*3/uL (ref 0.0–0.7)
Eosinophils Relative: 3 % (ref 0–5)
HCT: 42.4 % (ref 39.0–52.0)
HEMOGLOBIN: 15.3 g/dL (ref 13.0–17.0)
LYMPHS PCT: 18 % (ref 12–46)
Lymphs Abs: 2.2 10*3/uL (ref 0.7–4.0)
MCH: 32 pg (ref 26.0–34.0)
MCHC: 36.1 g/dL — AB (ref 30.0–36.0)
MCV: 88.7 fL (ref 78.0–100.0)
MONOS PCT: 6 % (ref 3–12)
Monocytes Absolute: 0.8 10*3/uL (ref 0.1–1.0)
NEUTROS ABS: 8.7 10*3/uL — AB (ref 1.7–7.7)
Neutrophils Relative %: 72 % (ref 43–77)
Platelets: 255 10*3/uL (ref 150–400)
RBC: 4.78 MIL/uL (ref 4.22–5.81)
RDW: 12.5 % (ref 11.5–15.5)
WBC: 12.2 10*3/uL — AB (ref 4.0–10.5)

## 2014-03-21 LAB — URINALYSIS, ROUTINE W REFLEX MICROSCOPIC
Glucose, UA: NEGATIVE mg/dL
Ketones, ur: 40 mg/dL — AB
NITRITE: NEGATIVE
PH: 6 (ref 5.0–8.0)
Protein, ur: >300 mg/dL — AB
SPECIFIC GRAVITY, URINE: 1.034 — AB (ref 1.005–1.030)
Urobilinogen, UA: 1 mg/dL (ref 0.0–1.0)

## 2014-03-21 LAB — URINE MICROSCOPIC-ADD ON

## 2014-03-21 MED ORDER — HYDROMORPHONE HCL PF 1 MG/ML IJ SOLN
1.0000 mg | Freq: Once | INTRAMUSCULAR | Status: DC
  Filled 2014-03-21: qty 1

## 2014-03-21 MED ORDER — KETOROLAC TROMETHAMINE 30 MG/ML IJ SOLN
30.0000 mg | Freq: Once | INTRAMUSCULAR | Status: AC
  Administered 2014-03-21: 30 mg via INTRAVENOUS
  Filled 2014-03-21: qty 1

## 2014-03-21 MED ORDER — HYDROMORPHONE HCL PF 1 MG/ML IJ SOLN
1.0000 mg | Freq: Once | INTRAMUSCULAR | Status: AC
  Administered 2014-03-21: 1 mg via INTRAVENOUS
  Filled 2014-03-21: qty 1

## 2014-03-21 MED ORDER — ONDANSETRON HCL 4 MG/2ML IJ SOLN
4.0000 mg | INTRAMUSCULAR | Status: AC
  Administered 2014-03-21: 4 mg via INTRAVENOUS
  Filled 2014-03-21: qty 2

## 2014-03-21 MED ORDER — OXYCODONE-ACETAMINOPHEN 5-325 MG PO TABS
2.0000 | ORAL_TABLET | Freq: Once | ORAL | Status: AC
  Administered 2014-03-21: 2 via ORAL
  Filled 2014-03-21: qty 2

## 2014-03-21 NOTE — ED Provider Notes (Signed)
CSN: [id number]     Arrival date & time 03/21/14  0305 History   First MD Initiated Contact with Patient 03/21/14 [phone]     Chief Complaint  Patient presents with  . Nephrolithiasis    (Consider location/radiation/quality/duration/timing/severity/associated sxs/prior Treatment) HPI Comments: 38 year old male with a history of kidney stones and known 1.2 cm kidney stone at right UPJ presents for worsening R flank pain. Patient is 4 days status post attempted ureteroscopy by Dr. Margarita Grizzle. Procedure was unsuccessful; however, stent was able to be placed without complications. Patient has been taking Percocet, Ditropan, and Pyridium as well as Keflex for symptoms. He states that these medications have adequately been able to control his flank pain, but pain or worsened in tensely at 0300 this morning. Patient states the pain is deep, aching, and waxing and waning in nature. Symptoms associated with nausea and one episode of nonbloody emesis. Patient states his pain has not been able to be controlled with Percocet or Ditropan. He has been experiencing hematuria since stent placement. Patient denies associated fever, chest pain, shortness of breath, hematemesis, diarrhea, melena or hematochezia, dysuria, urinary frequency or urgency, incontinence, numbness/tingling, and weakness.   Patient is scheduled to followup with Dr. Margarita Grizzle on Tuesday.  The history is provided by the patient. No language interpreter was used.    Past Medical History  Diagnosis Date  . Right ureteral stone   . Right nephrolithiasis   . History of kidney stones   . Acid reflux     WATCHES DIET   Past Surgical History  Procedure Laterality Date  . Hernia repair  AGE 37    ABDOMINAL  . Cystoscopy with retrograde pyelogram, ureteroscopy and stent placement Right 03/17/2014    Procedure: CYSTOSCOPY WITH RETROGRADE PYELOGRAM,  AND STENT PLACEMENT;  Surgeon: Magdalene Molly, MD;  Location: [facility];  Service:  Urology;  Laterality: Right;   Family History  Problem Relation Age of Onset  . Kidney Stones Mother   . Kidney Stones Father   . Diabetes Father   . Hypertension Father    History  Substance Use Topics  . Smoking status: Current Some Day Smoker -- 16 years    Types: Cigarettes  . Smokeless tobacco: Never Used     Comment: SOCIAL SMOKER  . Alcohol Use: No    Review of Systems  Constitutional: Negative for fever.  Gastrointestinal: Positive for nausea, vomiting and abdominal pain.  Genitourinary: Positive for hematuria and flank pain. Negative for dysuria, discharge, difficulty urinating, penile pain and testicular pain.  Skin: Negative for rash.  All other systems reviewed and are negative.     Allergies  Review of patient's allergies indicates no known allergies.  Home Medications   Prior to Admission medications   Medication Sig Start Date End Date Taking? Authorizing Provider  cephALEXin (KEFLEX) 500 MG capsule Take 1 capsule (500 mg total) by mouth 3 (three) times daily. 03/17/14   Magdalene Molly, MD  naproxen sodium (ANAPROX) 220 MG tablet Take 440 mg by mouth 2 (two) times daily as needed (pain).    Historical Provider, MD  ondansetron (ZOFRAN ODT) 8 MG disintegrating tablet Take 1 tablet (8 mg total) by mouth every 8 (eight) hours as needed for nausea or vomiting. 02/25/14   Olivia Mackie, MD  oxybutynin (DITROPAN) 5 MG tablet Take 1 tablet (5 mg total) by mouth every 6 (six) hours as needed for bladder spasms. 03/17/14   Magdalene Molly, MD  oxyCODONE-acetaminophen (PERCOCET/ROXICET) [phone]  MG per tablet Take 1-2 tablets by mouth every 4 (four) hours as needed for severe pain. 03/17/14   Magdalene Molly, MD  phenazopyridine (PYRIDIUM) 200 MG tablet Take 1 tablet (200 mg total) by mouth 3 (three) times daily as needed for pain. 03/17/14   Magdalene Molly, MD  senna-docusate (SENOKOT S) 8.6-50 MG per tablet Take 1 tablet by mouth 2 (two) times daily. 03/17/14   Magdalene Molly, MD  tamsulosin (FLOMAX) 0.4 MG CAPS capsule Take 1 capsule (0.4 mg total) by mouth daily. 02/25/14   Olivia Mackie, MD   BP 144/89  Pulse 60  Temp(Src) 98.7 F (37.1 C)  Resp 18  SpO2 96%  Physical Exam  Nursing note and vitals reviewed. Constitutional: He is oriented to person, place, and time. He appears well-developed and well-nourished. No distress.  Patient appears moderately uncomfortable, hunched over chair in exam room.  HENT:  Head: Normocephalic and atraumatic.  Eyes: Conjunctivae and EOM are normal. No scleral icterus.  Neck: Normal range of motion.  Cardiovascular: Normal rate, regular rhythm and normal heart sounds.   Pulmonary/Chest: Effort normal. No respiratory distress. He has no wheezes. He has no rales.  Abdominal: Soft. Normal appearance. He exhibits no distension, no ascites and no mass. There is tenderness in the right lower quadrant. There is CVA tenderness (right). There is no rigidity, no rebound, no guarding and negative Murphy's sign.    Abdomen soft without masses. TTP appreciated in R mid abdomen. R CVA TTP also appreciated. No peritoneal signs or involuntary guarding.  Musculoskeletal: Normal range of motion.  Neurological: He is alert and oriented to person, place, and time.  GCS 15. Speech is goal oriented.  Skin: Skin is warm and dry. No rash noted. He is not diaphoretic. No erythema. No pallor.  Psychiatric: He has a normal mood and affect. His behavior is normal.    ED Course  Procedures (including critical care time) Labs Review Labs Reviewed  CBC WITH DIFFERENTIAL - Abnormal; Notable for the following:    WBC 12.2 (*)    MCHC 36.1 (*)    Neutro Abs 8.7 (*)    All other components within normal limits  BASIC METABOLIC PANEL - Abnormal; Notable for the following:    Glucose, Bld 121 (*)    GFR calc non Af Amer 76 (*)    GFR calc Af Amer 88 (*)    All other components within normal limits  URINALYSIS, ROUTINE W REFLEX  MICROSCOPIC    Imaging Review US Renal  03/21/2014   CLINICAL DATA:  Nephrolithiasis, right flank pain. Status post right stent placement Mar 17, 2014.  EXAM: RENAL/URINARY TRACT ULTRASOUND COMPLETE  COMPARISON:  [facility] - NRPT dated 03/17/2014; CT ABD/PELV WO CM dated 02/25/2014  FINDINGS: Right Kidney:  Length: 13.1 cm. At least moderate right hydronephrosis, with 8 mm echogenic right lower pole and pelvic nephrolithiasis with acoustic shadowing.  Left Kidney:  Length: 12.7 cm. Echogenicity within normal limits. No mass or hydronephrosis visualized.  Bladder:  Appears normal for degree of bladder distention. Left ureteral jet identified, not apparent on the right.  IMPRESSION: At least moderate right hydronephrosis, no sonographic findings of nephro ureteral stent though, CT would be more sensitive. Right nephrolithiasis measuring up to 8 mm.   Electronically Signed   By: Awilda Metro   On: 03/21/2014 05:34     EKG Interpretation None      MDM   Final diagnoses:  Right flank  pain  Ureterolithiasis    [id number] - 38 year old male with a known 1.2 cm right nephrolithiasis presents for worsening R flank pain. Symptoms associated with hematuria, nausea, NB/NB emesis x 1. No improvement with percocet PTA. Patient has seen Dr. Margarita GrizzleWoodruff regarding symptoms. He is s/p unsuccessful ureteroscopy; did successfully have stent placed during this procedure. F/u appt in 2 days. He is on Keflex prophylactically.  Physical exam findings today strongly suggest pain secondary to kidney stone. Labs with improving leukocytosis. Kidney function preserved, though Cr function has slightly worsened since 1 month ago. Renal U/S and UA pending. Will attempt pain management with Dilaudid and nausea management with Zofran.  [id number] - Imaging reviewed which shows at least moderate hydronephrosis on U/S, worsened from trace hydronephrosis on CT imaging 1 month ago. Patient received both Dilaudid and Toradol. He  states pain has "gone down from a 13/10 to 0/10". He states "I fee dandy". Denies nausea. UA pending. Urine visualized prior to it being sent to the lab; red in color, free of clots.  0600 - UA does not suggest infection; gross hematuria noted. Pain still well controlled. Plan includes d/c with outpatient follow up at urology appt Tuesday. Have recommended patient add ibuprofen or Naproxen to current regimen for more adequate pain control. Return precautions discussed and provided. Patient agreeable to plan with no unaddressed concerns.   Filed Vitals:   03/21/14 0314  BP: 144/89  Pulse: 60  Temp: 98.7 F (37.1 C)  Resp: 18  SpO2: 96%       Antony MaduraKelly Maelys Kinnick, PA-C 03/21/14 [phone]

## 2014-03-21 NOTE — ED Provider Notes (Signed)
Medical screening examination/treatment/procedure(s) were performed by non-physician practitioner and as supervising physician I was immediately available for consultation/collaboration.    Vida Roller, MD 03/21/14 [phone]

## 2014-03-21 NOTE — Discharge Instructions (Signed)
Recommend you continue taking the medications prescribed by Dr. Margarita Grizzle. Add Naproxen, 1 tablet every 12 hours, OR Ibuprofen, 600mg  every 6 hours, to your pain management regimen. If you are having a procedure performed on Tuesday, discontinue Naproxen or ibuprofen 24 hours before this appointment. Return the the ED if symptoms worsen.  Flank Pain Flank pain refers to pain that is located on the side of the body between the upper abdomen and the back. The pain may occur over a short period of time (acute) or may be long-term or reoccurring (chronic). It may be mild or severe. Flank pain can be caused by many things. CAUSES  Some of the more common causes of flank pain include:  Muscle strains.   Muscle spasms.   A disease of your spine (vertebral disk disease).   A lung infection (pneumonia).   Fluid around your lungs (pulmonary edema).   A kidney infection.   Kidney stones.   A very painful skin rash caused by the chickenpox virus (shingles).   Gallbladder disease.  HOME CARE INSTRUCTIONS  Home care will depend on the cause of your pain. In general,  Rest as directed by your caregiver.  Drink enough fluids to keep your urine clear or pale yellow.  Only take over-the-counter or prescription medicines as directed by your caregiver. Some medicines may help relieve the pain.  Tell your caregiver about any changes in your pain.  Follow up with your caregiver as directed. SEEK IMMEDIATE MEDICAL CARE IF:   Your pain is not controlled with medicine.   You have new or worsening symptoms.  Your pain increases.   You have abdominal pain.   You have shortness of breath.   You have persistent nausea or vomiting.   You have swelling in your abdomen.   You feel faint or pass out.   You have blood in your urine.  You have a fever or persistent symptoms for more than 2 3 days.  You have a fever and your symptoms suddenly get worse. MAKE SURE YOU:    Understand these instructions.  Will watch your condition.  Will get help right away if you are not doing well or get worse. Document Released: 12/06/2005 Document Revised: 07/09/2012 Document Reviewed: 05/29/2012 [facility] Patient Information 2014 [place], [place].

## 2014-03-21 NOTE — ED Notes (Signed)
Patient is alert and oriented x3.  He is complaining of pain due to a 10 mm kidney stone that  Is stuck in his right kidney.  Currently he rates his pain 10 of 10 with nausea. He was operated on  Wednesday and a stent was attempted to be placed with no success.

## 2014-03-21 NOTE — ED Notes (Signed)
Pt. Went too the bathroom and attempted to urinate, unsuccessful at this time.

## 2014-03-23 ENCOUNTER — Other Ambulatory Visit: Payer: Self-pay | Admitting: Urology

## 2014-03-24 ENCOUNTER — Encounter (HOSPITAL_BASED_OUTPATIENT_CLINIC_OR_DEPARTMENT_OTHER): Payer: Self-pay | Admitting: *Deleted

## 2014-03-29 ENCOUNTER — Encounter (HOSPITAL_BASED_OUTPATIENT_CLINIC_OR_DEPARTMENT_OTHER): Payer: Self-pay | Admitting: *Deleted

## 2014-03-29 NOTE — Progress Notes (Addendum)
NPO AFTER MN. ARRIVE AT [id number]. CURRENT LAB RESULTS IN CHART AND EPIC.  MAY TAKE PRN MEDS IF NEEDED W/ SIPS OF WATER AM DOS.

## 2014-03-31 ENCOUNTER — Encounter (HOSPITAL_BASED_OUTPATIENT_CLINIC_OR_DEPARTMENT_OTHER): Payer: Self-pay | Admitting: Anesthesiology

## 2014-03-31 ENCOUNTER — Encounter (HOSPITAL_BASED_OUTPATIENT_CLINIC_OR_DEPARTMENT_OTHER)
Admission: RE | Disposition: A | Discharge status: Home / Self Care | Payer: Self-pay | Source: Ambulatory Visit | Attending: Urology

## 2014-03-31 ENCOUNTER — Encounter (HOSPITAL_BASED_OUTPATIENT_CLINIC_OR_DEPARTMENT_OTHER): Payer: Self-pay | Admitting: *Deleted

## 2014-03-31 ENCOUNTER — Ambulatory Visit (HOSPITAL_BASED_OUTPATIENT_CLINIC_OR_DEPARTMENT_OTHER)
Admission: RE | Admit: 2014-03-31 | Discharge: 2014-03-31 | Disposition: A | Discharge status: Home / Self Care | Payer: Self-pay | Source: Ambulatory Visit | Attending: Urology | Admitting: Urology

## 2014-03-31 ENCOUNTER — Ambulatory Visit (HOSPITAL_BASED_OUTPATIENT_CLINIC_OR_DEPARTMENT_OTHER): Payer: Self-pay | Admitting: Anesthesiology

## 2014-03-31 DIAGNOSIS — N200 Calculus of kidney: Secondary | ICD-10-CM | POA: Insufficient documentation

## 2014-03-31 DIAGNOSIS — F17200 Nicotine dependence, unspecified, uncomplicated: Secondary | ICD-10-CM | POA: Insufficient documentation

## 2014-03-31 DIAGNOSIS — Z79899 Other long term (current) drug therapy: Secondary | ICD-10-CM | POA: Insufficient documentation

## 2014-03-31 HISTORY — PX: CYSTOSCOPY WITH RETROGRADE PYELOGRAM, URETEROSCOPY AND STENT PLACEMENT: SHX5789

## 2014-03-31 HISTORY — DX: Crossing vessel and stricture of ureter without hydronephrosis: N13.5

## 2014-03-31 HISTORY — PX: HOLMIUM LASER APPLICATION: SHX5852

## 2014-03-31 SURGERY — CYSTOURETEROSCOPY, WITH RETROGRADE PYELOGRAM AND STENT INSERTION
Anesthesia: General | Site: Ureter | Laterality: Right

## 2014-03-31 MED ORDER — LACTATED RINGERS IV SOLN
INTRAVENOUS | Status: DC
  Administered 2014-03-31 (×2): via INTRAVENOUS
  Filled 2014-03-31: qty 1000

## 2014-03-31 MED ORDER — ACETAMINOPHEN 10 MG/ML IV SOLN
INTRAVENOUS | Status: DC | PRN
  Administered 2014-03-31: 1000 mg via INTRAVENOUS

## 2014-03-31 MED ORDER — FENTANYL CITRATE 0.05 MG/ML IJ SOLN
25.0000 ug | INTRAMUSCULAR | Status: DC | PRN
  Administered 2014-03-31 (×3): 50 ug via INTRAVENOUS
  Filled 2014-03-31: qty 1

## 2014-03-31 MED ORDER — MIDAZOLAM HCL 5 MG/5ML IJ SOLN
INTRAMUSCULAR | Status: DC | PRN
Start: 2014-03-31 — End: 2014-03-31
  Administered 2014-03-31: 2 mg via INTRAVENOUS

## 2014-03-31 MED ORDER — PHENAZOPYRIDINE HCL 100 MG PO TABS
ORAL_TABLET | ORAL | Status: AC
  Filled 2014-03-31: qty 2

## 2014-03-31 MED ORDER — SODIUM CHLORIDE 0.9 % IR SOLN
Status: DC | PRN
  Administered 2014-03-31: 3000 mL via INTRAVESICAL
  Administered 2014-03-31: 1000 mL via INTRAVESICAL

## 2014-03-31 MED ORDER — FENTANYL CITRATE 0.05 MG/ML IJ SOLN
INTRAMUSCULAR | Status: AC
  Filled 2014-03-31: qty 2

## 2014-03-31 MED ORDER — ONDANSETRON HCL 4 MG/2ML IJ SOLN
INTRAMUSCULAR | Status: DC | PRN
  Administered 2014-03-31: 4 mg via INTRAVENOUS

## 2014-03-31 MED ORDER — OXYBUTYNIN CHLORIDE 5 MG PO TABS
5.0000 mg | ORAL_TABLET | Freq: Four times a day (QID) | ORAL | Status: DC
  Filled 2014-03-31: qty 1

## 2014-03-31 MED ORDER — IOHEXOL 350 MG/ML SOLN
INTRAVENOUS | Status: DC | PRN
  Administered 2014-03-31: 13 mL via INTRAVENOUS

## 2014-03-31 MED ORDER — PROMETHAZINE HCL 25 MG/ML IJ SOLN
6.2500 mg | INTRAMUSCULAR | Status: DC | PRN
  Filled 2014-03-31: qty 1

## 2014-03-31 MED ORDER — BELLADONNA ALKALOIDS-OPIUM 16.2-60 MG RE SUPP
RECTAL | Status: AC
  Filled 2014-03-31: qty 1

## 2014-03-31 MED ORDER — KETOROLAC TROMETHAMINE 30 MG/ML IJ SOLN
15.0000 mg | Freq: Once | INTRAMUSCULAR | Status: DC | PRN
  Filled 2014-03-31: qty 1

## 2014-03-31 MED ORDER — OXYCODONE-ACETAMINOPHEN 5-325 MG PO TABS: 1.0000 | ORAL_TABLET | ORAL | Status: DC | PRN

## 2014-03-31 MED ORDER — PHENAZOPYRIDINE HCL 200 MG PO TABS
200.0000 mg | ORAL_TABLET | Freq: Three times a day (TID) | ORAL | Status: DC | PRN
  Administered 2014-03-31: 200 mg via ORAL
  Filled 2014-03-31: qty 1

## 2014-03-31 MED ORDER — CEPHALEXIN 500 MG PO CAPS: 500.0000 mg | ORAL_CAPSULE | Freq: Three times a day (TID) | ORAL | Status: DC

## 2014-03-31 MED ORDER — PROPOFOL 10 MG/ML IV BOLUS
INTRAVENOUS | Status: DC | PRN
  Administered 2014-03-31: 200 mg via INTRAVENOUS

## 2014-03-31 MED ORDER — OXYBUTYNIN CHLORIDE 5 MG PO TABS
5.0000 mg | ORAL_TABLET | Freq: Four times a day (QID) | ORAL | Status: DC
  Administered 2014-03-31: 5 mg via ORAL
  Filled 2014-03-31: qty 1

## 2014-03-31 MED ORDER — LIDOCAINE HCL 2 % EX GEL
CUTANEOUS | Status: DC | PRN
  Administered 2014-03-31: 1 via URETHRAL

## 2014-03-31 MED ORDER — KETOROLAC TROMETHAMINE 30 MG/ML IJ SOLN
INTRAMUSCULAR | Status: DC | PRN
Start: 2014-03-31 — End: 2014-03-31
  Administered 2014-03-31: 30 mg via INTRAVENOUS

## 2014-03-31 MED ORDER — CEFAZOLIN SODIUM-DEXTROSE 2-3 GM-% IV SOLR
2.0000 g | INTRAVENOUS | Status: AC
  Administered 2014-03-31: 2 g via INTRAVENOUS
  Filled 2014-03-31: qty 50

## 2014-03-31 MED ORDER — OXYCODONE-ACETAMINOPHEN 5-325 MG PO TABS
ORAL_TABLET | ORAL | Status: AC
  Filled 2014-03-31: qty 1

## 2014-03-31 MED ORDER — FENTANYL CITRATE 0.05 MG/ML IJ SOLN
INTRAMUSCULAR | Status: DC | PRN
  Administered 2014-03-31 (×4): 50 ug via INTRAVENOUS

## 2014-03-31 MED ORDER — DEXAMETHASONE SODIUM PHOSPHATE 4 MG/ML IJ SOLN
INTRAMUSCULAR | Status: DC | PRN
  Administered 2014-03-31: 10 mg via INTRAVENOUS

## 2014-03-31 MED ORDER — BELLADONNA ALKALOIDS-OPIUM 16.2-60 MG RE SUPP
RECTAL | Status: DC | PRN
Start: 2014-03-31 — End: 2014-03-31
  Administered 2014-03-31: 1 via RECTAL

## 2014-03-31 MED ORDER — OXYBUTYNIN CHLORIDE 5 MG PO TABS
ORAL_TABLET | ORAL | Status: AC
  Filled 2014-03-31: qty 1

## 2014-03-31 MED ORDER — LIDOCAINE HCL (CARDIAC) 20 MG/ML IV SOLN
INTRAVENOUS | Status: DC | PRN
  Administered 2014-03-31: 80 mg via INTRAVENOUS

## 2014-03-31 MED ORDER — OXYCODONE-ACETAMINOPHEN 5-325 MG PO TABS
1.0000 | ORAL_TABLET | ORAL | Status: DC | PRN
  Administered 2014-03-31: 1 via ORAL
  Filled 2014-03-31: qty 1

## 2014-03-31 MED ORDER — MIDAZOLAM HCL 2 MG/2ML IJ SOLN
INTRAMUSCULAR | Status: AC
  Filled 2014-03-31: qty 2

## 2014-03-31 MED ORDER — FENTANYL CITRATE 0.05 MG/ML IJ SOLN
INTRAMUSCULAR | Status: AC
  Filled 2014-03-31: qty 4

## 2014-03-31 SURGICAL SUPPLY — 45 items
BAG DRAIN URO-CYSTO SKYTR STRL (DRAIN) ×3 IMPLANT
BASKET LASER NITINOL 1.9FR (BASKET) IMPLANT
BASKET STNLS GEMINI 4WIRE 3FR (BASKET) IMPLANT
BASKET STONE 1.7 NGAGE (UROLOGICAL SUPPLIES) ×3 IMPLANT
BASKET ZERO TIP NITINOL 2.4FR (BASKET) IMPLANT
CANISTER SUCT LVC 12 LTR MEDI- (MISCELLANEOUS) ×3 IMPLANT
CATH CLEAR GEL 3F BACKSTOP (CATHETERS) IMPLANT
CATH INTERMIT  6FR 70CM (CATHETERS) IMPLANT
CATH URET 5FR 28IN CONE TIP (BALLOONS)
CATH URET 5FR 28IN OPEN ENDED (CATHETERS) ×3 IMPLANT
CATH URET 5FR 70CM CONE TIP (BALLOONS) IMPLANT
CATH URET DUAL LUMEN 6-10FR 50 (CATHETERS) ×3 IMPLANT
CLOTH BEACON ORANGE TIMEOUT ST (SAFETY) ×3 IMPLANT
DRAPE CAMERA CLOSED 9X96 (DRAPES) ×3 IMPLANT
ELECT REM PT RETURN 9FT ADLT (ELECTROSURGICAL)
ELECTRODE REM PT RTRN 9FT ADLT (ELECTROSURGICAL) IMPLANT
FIBER LASER FLEXIVA 200 (UROLOGICAL SUPPLIES) IMPLANT
FIBER LASER FLEXIVA 365 (UROLOGICAL SUPPLIES) IMPLANT
FIBER LASER TRAC TIP (UROLOGICAL SUPPLIES) ×3 IMPLANT
GLOVE BIO SURGEON STRL SZ7 (GLOVE) ×3 IMPLANT
GLOVE BIOGEL M STER SZ 6 (GLOVE) ×3 IMPLANT
GLOVE BIOGEL PI IND STRL 6.5 (GLOVE) ×2 IMPLANT
GLOVE BIOGEL PI INDICATOR 6.5 (GLOVE) ×4
GLOVE ECLIPSE 7.0 STRL STRAW (GLOVE) IMPLANT
GLOVE INDICATOR 7.5 STRL GRN (GLOVE) IMPLANT
GOWN PREVENTION PLUS LG XLONG (DISPOSABLE) IMPLANT
GOWN STRL REUS W/TWL LRG LVL3 (GOWN DISPOSABLE) ×3 IMPLANT
GOWN STRL REUS W/TWL XL LVL3 (GOWN DISPOSABLE) ×3 IMPLANT
GUIDEWIRE 0.038 PTFE COATED (WIRE) IMPLANT
GUIDEWIRE ANG ZIPWIRE 038X150 (WIRE) IMPLANT
GUIDEWIRE STR DUAL SENSOR (WIRE) ×3 IMPLANT
IV NS 1000ML (IV SOLUTION) ×2
IV NS 1000ML BAXH (IV SOLUTION) ×1 IMPLANT
IV NS IRRIG 3000ML ARTHROMATIC (IV SOLUTION) ×3 IMPLANT
KIT BALLIN UROMAX 15FX10 (LABEL) IMPLANT
KIT BALLN UROMAX 15FX4 (MISCELLANEOUS) IMPLANT
KIT BALLN UROMAX 26 75X4 (MISCELLANEOUS)
PACK CYSTOSCOPY (CUSTOM PROCEDURE TRAY) ×3 IMPLANT
SET HIGH PRES BAL DIL (LABEL)
SHEATH ACCESS URETERAL 38CM (SHEATH) IMPLANT
SHEATH ACCESS URETERAL 54CM (SHEATH) ×3 IMPLANT
SHEATH URET ACCESS 12FR/35CM (UROLOGICAL SUPPLIES) IMPLANT
SHEATH URET ACCESS 12FR/55CM (UROLOGICAL SUPPLIES) IMPLANT
STENT POLARIS 5FRX24 (STENTS) ×3 IMPLANT
SYRINGE IRR TOOMEY STRL 70CC (SYRINGE) IMPLANT

## 2014-03-31 NOTE — H&P (Signed)
Urology History and Physical Exam  CC: Right nephrolithiasis  HPI: 38 year old male presents today for right nephrolithiasis. He has a large stone in the right renal pelvis. This measures 1.2 cm in size. He also has a 3 mm stone in his right kidney. Attempted ureteroscopy was unsuccessful on 03/17/14, so right ureter stent was placed in preparation for a staged right ureteroscopy. We reviewed management options and he presents today for cystoscopy, staged right ureteroscopy, laser lithotripsy, right retrograde pyelogram, possible right ureter stent exchange, and possible right ureter dilation. We've discussed management options and he would prefer to have a right ureter dilation rather than a percutaneous procedure in the future.  Urine culture 03/23/14 was negative for growth.   PMH: Past Medical History  Diagnosis Date  . Right nephrolithiasis   . History of kidney stones   . Acid reflux     WATCHES DIET  . Ureteral stenosis, right     PSH: Past Surgical History  Procedure Laterality Date  . Hernia repair  AGE 38    ABDOMINAL  . Cystoscopy with retrograde pyelogram, ureteroscopy and stent placement Right 03/17/2014    Procedure: CYSTOSCOPY WITH RETROGRADE PYELOGRAM,  AND STENT PLACEMENT;  Surgeon: Magdalene Molly, MD;  Location: [facility];  Service: Urology;  Laterality: Right;    Allergies: No Known Allergies  Medications: Prescriptions prior to admission  Medication Sig Dispense Refill  . oxybutynin (DITROPAN) 5 MG tablet Take 1 tablet (5 mg total) by mouth every 6 (six) hours as needed for bladder spasms.  40 tablet  4  . oxyCODONE-acetaminophen (PERCOCET/ROXICET) 5-325 MG per tablet Take 1-2 tablets by mouth every 4 (four) hours as needed for severe pain.  60 tablet  0  . phenazopyridine (PYRIDIUM) 200 MG tablet Take 1 tablet (200 mg total) by mouth 3 (three) times daily as needed for pain.  30 tablet  6     Social History: History   Social History  .  Marital Status: Single    Spouse Name: N/A    Number of Children: N/A  . Years of Education: N/A   Occupational History  . Not on file.   Social History Main Topics  . Smoking status: Current Some Day Smoker -- 16 years    Types: Cigarettes  . Smokeless tobacco: Never Used     Comment: SOCIAL SMOKER  . Alcohol Use: No  . Drug Use: No  . Sexual Activity: Not on file   Other Topics Concern  . Not on file   Social History Narrative  . No narrative on file    Family History: Family History  Problem Relation Age of Onset  . Kidney Stones Mother   . Kidney Stones Father   . Diabetes Father   . Hypertension Father     Review of Systems: Positive: Right flank pain. Negative: Fever, SOB, or chest pain.  A further 10 point review of systems was negative except what is listed in the HPI.  Physical Exam: Filed Vitals:   03/31/14 0822  BP: 140/91  Pulse: 84  Temp: 98.7 F (37.1 C)  Resp: 16    General: No acute distress.  Awake. Head:  Normocephalic.  Atraumatic. ENT:  EOMI.  Mucous membranes moist Neck:  Supple.  No lymphadenopathy. CV:  S1 present. S2 present. Regular rate. Pulmonary: Equal effort bilaterally.  Clear to auscultation bilaterally. Abdomen: Soft.  Non- tender to palpation. Skin:  Normal turgor.  No visible rash. Extremity: No gross deformity  of bilateral upper extremities.  No gross deformity of    bilateral lower extremities. Neurologic: Alert. Appropriate mood.    Studies:  No results found for this basename: HGB, WBC, PLT,  in the last 72 hours  No results found for this basename: NA, K, CL, CO2, BUN, CREATININE, CALCIUM, MAGNESIUM, GFRNONAA, GFRAA,  in the last 72 hours   No results found for this basename: PT, INR, APTT,  in the last 72 hours   No components found with this basename: ABG,     Assessment:  Right nephrolithiasis.  Plan: To OR for cystoscopy, staged right ureteroscopy, laser lithotripsy, right retrograde pyelogram,  possible right ureter stent exchange, and possible right ureter dilation.

## 2014-03-31 NOTE — Anesthesia Preprocedure Evaluation (Signed)
Anesthesia Evaluation  Patient identified by MRN, date of birth, ID band Patient awake    Reviewed: Allergy & Precautions, H&P , NPO status , Patient's Chart, lab work & pertinent test results  Airway Mallampati: II TM Distance: >3 FB Neck ROM: Full    Dental no notable dental hx.    Pulmonary Current Smoker,  breath sounds clear to auscultation  Pulmonary exam normal       Cardiovascular negative cardio ROS  Rhythm:Regular Rate:Normal     Neuro/Psych negative neurological ROS  negative psych ROS   GI/Hepatic negative GI ROS, Neg liver ROS,   Endo/Other  negative endocrine ROS  Renal/GU negative Renal ROS  negative genitourinary   Musculoskeletal negative musculoskeletal ROS (+)   Abdominal   Peds negative pediatric ROS (+)  Hematology negative hematology ROS (+)   Anesthesia Other Findings   Reproductive/Obstetrics negative OB ROS                           Anesthesia Physical Anesthesia Plan  ASA: II  Anesthesia Plan: General   Post-op Pain Management:    Induction: Intravenous  Airway Management Planned: LMA  Additional Equipment:   Intra-op Plan:   Post-operative Plan:   Informed Consent: I have reviewed the patients History and Physical, chart, labs and discussed the procedure including the risks, benefits and alternatives for the proposed anesthesia with the patient or authorized representative who has indicated his/her understanding and acceptance.   Dental advisory given  Plan Discussed with: CRNA and Surgeon  Anesthesia Plan Comments:         Anesthesia Quick Evaluation

## 2014-03-31 NOTE — Anesthesia Procedure Notes (Signed)
Procedure Name: LMA Insertion Date/Time: 03/31/2014 9:17 AM Performed by: Maris Berger T Pre-anesthesia Checklist: Patient identified, Emergency Drugs available, Suction available and Patient being monitored Patient Re-evaluated:Patient Re-evaluated prior to inductionOxygen Delivery Method: Circle System Utilized Preoxygenation: Pre-oxygenation with 100% oxygen Intubation Type: IV induction Ventilation: Mask ventilation without difficulty LMA: LMA inserted LMA Size: 4.0 Number of attempts: 1 Airway Equipment and Method: bite block Placement Confirmation: positive ETCO2 Dental Injury: Teeth and Oropharynx as per pre-operative assessment

## 2014-03-31 NOTE — Anesthesia Postprocedure Evaluation (Signed)
  Anesthesia Post-op Note  Patient: Roberto Herrera  Procedure(s) Performed: Procedure(s) (LRB): CYSTOSCOPY WITH RETROGRADE PYELOGRAM, URETEROSCOPY AND STENT EXCHANGE, stone extraction (Right) HOLMIUM LASER APPLICATION (Right)  Patient Location: PACU  Anesthesia Type: General  Level of Consciousness: awake and alert   Airway and Oxygen Therapy: Patient Spontanous Breathing  Post-op Pain: mild  Post-op Assessment: Post-op Vital signs reviewed, Patient's Cardiovascular Status Stable, Respiratory Function Stable, Patent Airway and No signs of Nausea or vomiting  Last Vitals:  Filed Vitals:   03/31/14 1045  BP: 132/89  Pulse: 70  Temp:   Resp: 19    Post-op Vital Signs: stable   Complications: No apparent anesthesia complications

## 2014-03-31 NOTE — Op Note (Signed)
Urology Operative Report  Date of Procedure: 03/31/14  Surgeon: Natalia Leatherwoodaniel Adyen Bifulco, MD Assistant:  None  Preoperative Diagnosis: Right nephrolithiasis. Postoperative Diagnosis:  Same  Procedure(s): Staged right ureteroscopy with laser lithotripsy and stone removal. Right retrograde pyelogram with interpretation. Right ureter stent removal. Right ureter stent placement (5 x 26 polaris without tether). Cystoscopy.  Estimated blood loss: Minimal  Specimen: Stones sent to [facility] lab for chemical analysis.  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 Large right renal stone. Small right renal stone.  History of present illness: 38 year old male presents today for staged right ureteroscopy. Attempted ureteroscopy was unsuccessful due to right ureter stenosis and a stent was placed. He returns today for staged procedure.   Procedure in detail: After informed consent was obtained, the patient was taken to the operating room. They were placed in the supine position. SCDs were turned on and in place. IV antibiotics were infused, and general anesthesia was induced. A timeout was performed in which the correct patient, surgical site, and procedure were identified and agreed upon by the team.  The patient was placed in a dorsolithotomy position, making sure to pad all pertinent neurovascular pressure points. A belladonna and opium suppository was placed into the rectum. The genitals were prepped and draped in the usual sterile fashion.  A rigid cystoscope was advanced through the urethra and into the bladder. A stent grasper was used to pull the distal end of the stent to the urethra meatus. A sensor wire was passed through the stent and up into the right renal pelvis on fluoroscopy. The large right renal stone was visible on fluoroscopy. The ureter stent was removed.  I obtained a right retrograde pyelogram by placing a dual-lumen ureter  access sheath over the safety wire into the distal ureter on fluoroscopy. I then injected 6 cc of Omnipaque into the distal ureter and the sternal up the ureter and the left renal calyces. There was noted to be stenosis of an upper mid pole calyx. There is also a filling defect consistent with stone. This side did not drain well.  A second sensor wire was placed up in the right renal pelvis under fluoroscopy through the dual-lumen access sheath and then access sheath was passed up to the right renal pelvis with ease. Access sheath was then removed.  I then placed the obturator to a 12-14 ureter access sheath over the working wire up into the right renal pelvis on fluoroscopy with mild amount of resistance. The obturator was removed and then the obturator and sheath were placed over the working wire under fluoroscopy into the right renal pelvis with mild resistance. The obturator and working wire were removed. The safety wire was secured to the drape.  Flexible digital ureter scope was placed through the access sheath into the right renal pelvis. This was evaluated in a systematic fashion and visualized all of the calyces. There was noted to be a very large stone in the lower pole calyx. This was grasped with a basket and placed in an upper calyx. Lithotripsy was then carried out with a 200  holmium laser filament at 0.5 J and 20 Hz. The fragments were then removed with a basket. There were smaller fragments remaining which were too small to be grasped and it was felt that these would wash out with stent placement.  I then withdrew the ureter access sheath and ureter scope to visualize the entire length of the ureter. There was no injury to  the ureter noted. I then injected an additional 8 cc of Omnipaque through the ureter scope to obtain another retrograde PolyGram to help with stent placement. There was no extravasation noted.  I then loaded the safety wire through the cystoscope and placed a 5 x 26  Polaris stent without tether over the wire through the cystoscope under fluoroscopy with ease. This was deployed with the proximal end of the stent in the upper pole calyx and the loops within the bladder. The bladder was then drained. Cystoscope was removed. I placed 10 cc of lidocaine jelly into the urethra.  This completed the procedure. He's placed back in a supine position, anesthesia was reversed, and he was taken to the [facility] in stable condition.  All counts were correct at the end of the case.  He will be given Keflex to start the day before his clinic followup for cystoscopy/stent removal.

## 2014-03-31 NOTE — Discharge Instructions (Signed)
DISCHARGE INSTRUCTIONS FOR KIDNEY STONES OR URETERAL STENT ° °MEDICATIONS:  ° °1. Resume all your other meds from home. ° °ACTIVITY °1. No strenuous activity x 1week °2. No driving while on narcotic pain medications °3. Drink plenty of water °4. Continue to walk at home - you can still get blood clots when you are at home, so keep active, but don't over do it. °5. May return to work in 3 days. ° °BATHING °1. You can shower or bath. ° ° ° °SIGNS/SYMPTOMS TO CALL: °1. Please call us if you have a fever greater than 101.5, uncontrolled  °nausea/vomiting, uncontrolled pain, dizziness, unable to urinate, chest pain, shortness of breath, leg swelling, leg pain, redness around wound, drainage from wound, or any other concerns or questions. ° °You can reach us at [phone]. ° ° °Post Anesthesia Home Care Instructions ° °Activity: °Get plenty of rest for the remainder of the day. A responsible adult should stay with you for 24 hours following the procedure.  °For the next 24 hours, DO NOT: °-Drive a car °-Operate machinery °-Drink alcoholic beverages °-Take any medication unless instructed by your physician °-Make any legal decisions or sign important papers. ° °Meals: °Start with liquid foods such as gelatin or soup. Progress to regular foods as tolerated. Avoid greasy, spicy, heavy foods. If nausea and/or vomiting occur, drink only clear liquids until the nausea and/or vomiting subsides. Call your physician if vomiting continues. ° °Special Instructions/Symptoms: °Your throat may feel dry or sore from the anesthesia or the breathing tube placed in your throat during surgery. If this causes discomfort, gargle with warm salt water. The discomfort should disappear within 24 hours. ° ° ° °

## 2014-03-31 NOTE — Transfer of Care (Signed)
Immediate Anesthesia Transfer of Care Note  Patient: Roberto Herrera  Procedure(s) Performed: Procedure(s): CYSTOSCOPY WITH RETROGRADE PYELOGRAM, URETEROSCOPY AND STENT EXCHANGE, stone extraction (Right) HOLMIUM LASER APPLICATION (Right)  Patient Location: PACU  Anesthesia Type:General  Level of Consciousness: awake and oriented  Airway & Oxygen Therapy: Patient Spontanous Breathing and Patient connected to nasal cannula oxygen  Post-op Assessment: Report given to PACU RN  Post vital signs: Reviewed and stable  Complications: No apparent anesthesia complications

## 2014-04-01 ENCOUNTER — Encounter (HOSPITAL_BASED_OUTPATIENT_CLINIC_OR_DEPARTMENT_OTHER): Payer: Self-pay | Admitting: Urology

## 2014-09-28 ENCOUNTER — Encounter (HOSPITAL_BASED_OUTPATIENT_CLINIC_OR_DEPARTMENT_OTHER): Payer: Self-pay | Admitting: Urology

## 2015-06-29 ENCOUNTER — Emergency Department (HOSPITAL_COMMUNITY)
Admission: EM | Admit: 2015-06-29 | Discharge: 2015-06-29 | Disposition: A | Discharge status: Home / Self Care | Payer: Self-pay | Attending: Emergency Medicine | Admitting: Emergency Medicine

## 2015-06-29 ENCOUNTER — Encounter (HOSPITAL_COMMUNITY): Payer: Self-pay | Admitting: Emergency Medicine

## 2015-06-29 DIAGNOSIS — Z8719 Personal history of other diseases of the digestive system: Secondary | ICD-10-CM | POA: Insufficient documentation

## 2015-06-29 DIAGNOSIS — M545 Low back pain: Secondary | ICD-10-CM | POA: Insufficient documentation

## 2015-06-29 DIAGNOSIS — Z87442 Personal history of urinary calculi: Secondary | ICD-10-CM | POA: Insufficient documentation

## 2015-06-29 DIAGNOSIS — G43109 Migraine with aura, not intractable, without status migrainosus: Secondary | ICD-10-CM

## 2015-06-29 DIAGNOSIS — Z720 Tobacco use: Secondary | ICD-10-CM | POA: Insufficient documentation

## 2015-06-29 DIAGNOSIS — G43009 Migraine without aura, not intractable, without status migrainosus: Secondary | ICD-10-CM | POA: Insufficient documentation

## 2015-06-29 DIAGNOSIS — Z87448 Personal history of other diseases of urinary system: Secondary | ICD-10-CM | POA: Insufficient documentation

## 2015-06-29 MED ORDER — KETOROLAC TROMETHAMINE 30 MG/ML IJ SOLN
30.0000 mg | Freq: Once | INTRAMUSCULAR | Status: AC
  Administered 2015-06-29: 30 mg via INTRAVENOUS
  Filled 2015-06-29: qty 1

## 2015-06-29 MED ORDER — DIPHENHYDRAMINE HCL 50 MG/ML IJ SOLN
25.0000 mg | Freq: Once | INTRAMUSCULAR | Status: AC
  Administered 2015-06-29: 25 mg via INTRAVENOUS
  Filled 2015-06-29: qty 1

## 2015-06-29 MED ORDER — METOCLOPRAMIDE HCL 5 MG/ML IJ SOLN
10.0000 mg | Freq: Once | INTRAMUSCULAR | Status: AC
  Administered 2015-06-29: 10 mg via INTRAVENOUS
  Filled 2015-06-29: qty 2

## 2015-06-29 MED ORDER — SODIUM CHLORIDE 0.9 % IV BOLUS (SEPSIS)
1000.0000 mL | Freq: Once | INTRAVENOUS | Status: AC
  Administered 2015-06-29: 1000 mL via INTRAVENOUS

## 2015-06-29 NOTE — ED Notes (Signed)
Patient states that around 3 am this morning he began to have severe headache in his right temporal area, extending to right ear and jaw.  Patient states initially he had blurred vision in right eye, but that has since resolved.  Patient endorses photophobia.  Patient denies N/V.  Patient denies weakness, generalized or unilateral.  Patient denies abdominal pain, cough, fever and nasal or chest congestion.    On exam, PERRL, EOMI. MAE, no arm drift.  Lung sounds are clear to auscultation in all lobes.  Heart sounds WNL, S1/S2, no rub, murmur or gallop.  +2 radial and pedal pulses palpated.

## 2015-06-29 NOTE — Discharge Instructions (Signed)
Migraine Headache A migraine headache is an intense, throbbing pain on one or both sides of your head. A migraine can last for 30 minutes to several hours. CAUSES  The exact cause of a migraine headache is not always known. However, a migraine may be caused when nerves in the brain become irritated and release chemicals that cause inflammation. This causes pain. Certain things may also trigger migraines, such as:  Alcohol.  Smoking.  Stress.  Menstruation.  Aged cheeses.  Foods or drinks that contain nitrates, glutamate, aspartame, or tyramine.  Lack of sleep.  Chocolate.  Caffeine.  Hunger.  Physical exertion.  Fatigue.  Medicines used to treat chest pain (nitroglycerine), birth control pills, estrogen, and some blood pressure medicines. SIGNS AND SYMPTOMS  Pain on one or both sides of your head.  Pulsating or throbbing pain.  Severe pain that prevents daily activities.  Pain that is aggravated by any physical activity.  Nausea, vomiting, or both.  Dizziness.  Pain with exposure to bright lights, loud noises, or activity.  General sensitivity to bright lights, loud noises, or smells. Before you get a migraine, you may get warning signs that a migraine is coming (aura). An aura may include:  Seeing flashing lights.  Seeing bright spots, halos, or zigzag lines.  Having tunnel vision or blurred vision.  Having feelings of numbness or tingling.  Having trouble talking.  Having muscle weakness. DIAGNOSIS  A migraine headache is often diagnosed based on:  Symptoms.  Physical exam.  A CT scan or MRI of your head. These imaging tests cannot diagnose migraines, but they can help rule out other causes of headaches. TREATMENT Medicines may be given for pain and nausea. Medicines can also be given to help prevent recurrent migraines.  HOME CARE INSTRUCTIONS  Only take over-the-counter or prescription medicines for pain or discomfort as directed by your  health care provider. The use of long-term narcotics is not recommended.  Lie down in a dark, quiet room when you have a migraine.  Keep a journal to find out what may trigger your migraine headaches. For example, write down:  What you eat and drink.  How much sleep you get.  Any change to your diet or medicines.  Limit alcohol consumption.  Quit smoking if you smoke.  Get 7-9 hours of sleep, or as recommended by your health care provider.  Limit stress.  Keep lights dim if bright lights bother you and make your migraines worse. SEEK IMMEDIATE MEDICAL CARE IF:   Your migraine becomes severe.  You have a fever.  You have a stiff neck.  You have vision loss.  You have muscular weakness or loss of muscle control.  You start losing your balance or have trouble walking.  You feel faint or pass out.  You have severe symptoms that are different from your first symptoms. MAKE SURE YOU:   Understand these instructions.  Will watch your condition.  Will get help right away if you are not doing well or get worse. Document Released: 10/15/2005 Document Revised: 03/01/2014 Document Reviewed: 06/22/2013 Exit[employer] Patient Information 2015 [employer]. This information is not intended to replace advice given to you by your health care provider. Make sure you discuss any questions you have with your health care provider. Cluster Headache Cluster headaches are recognized by their pattern of deep, intense head pain. They normally occur on one side of your head, but they may "switch sides" in subsequent episodes. Typically, cluster headaches:   Are severe in nature.     Occur repeatedly over weeks to months and are followed by periods of no headaches.   Can last from 15 minutes to 3 hours.   Occur at the same time each day, often at night.   Occur several times a day. CAUSES The exact cause of cluster headaches is not known. Alcohol use may be associated with cluster  headaches. SIGNS AND SYMPTOMS   Severe pain that begins in or around your eye or temple.   One-sided head pain.   Feeling sick to your stomach (nauseous).   Sensitivity to light.   Runny nose.   Eye redness, tearing, and nasal stuffiness on the side of your head where you are experiencing pain.   Sweaty, pale skin of the face.   Droopy or swollen eyelid.   Restlessness. DIAGNOSIS  Cluster headaches are diagnosed based on symptoms and a physical exam. Your health care provider may order a CT scan or an MRI of your head or lab tests to see if your headaches are caused by other medical conditions.  TREATMENT   Medicines for pain relief and to prevent recurrent attacks. Some people may need a combination of medicines.  Oxygen for pain relief.   Biofeedback programs to help reduce headache pain.  It may be helpful to keep a headache diary. This may help you find a trend for what is triggering your headaches. Your health care provider can develop a treatment plan.  HOME CARE INSTRUCTIONS  During cluster periods:   Follow a regular sleep schedule. Do not vary the amount and time that you sleep from day to day. It is important to stay on the same schedule during a cluster period to help prevent headaches.   Avoid alcohol.   Stop smoking if you smoke.  SEEK MEDICAL CARE IF:  You have any changes from your previous cluster headaches either in intensity or frequency.   You are not getting relief from medicines you are taking.  SEEK IMMEDIATE MEDICAL CARE IF:   You faint.   You have weakness or numbness, especially on one side of your body or face.   You have double vision.   You have nausea or vomiting that is not relieved within several hours.   You cannot keep your balance or have difficulty talking or walking.   You have neck pain or stiffness.   You have a fever. MAKE SURE YOU:  Understand these instructions.   Will watch your condition.    Will get help right away if you are not doing well or get worse. Document Released: 10/15/2005 Document Revised: 08/05/2013 Document Reviewed: 05/07/2013 ExitCare Patient Information 2015 [employer]. This information is not intended to replace advice given to you by your health care provider. Make sure you discuss any questions you have with your health care provider.  

## 2015-06-29 NOTE — ED Provider Notes (Signed)
CSN: [id number]     Arrival date & time 06/29/15  4098 History   First MD Initiated Contact with Patient 06/29/15 0732     Chief Complaint  Patient presents with  . Migraine     (Consider location/radiation/quality/duration/timing/severity/associated sxs/prior Treatment) HPI  Roberto Herrera 38 y.o.male  PCP: No primary care provider on file.  Blood pressure 130/79, pulse 83, temperature 98.4 F (36.9 C), temperature source Oral, resp. rate 20, SpO2 96 %.  SIGNIFICANT PMH: right nephrolithiasis, right kidney stones, acid reflux, ureteral stenosis CHIEF COMPLAINT: Migraine headache  When: started approx 3 am this morning How: patient woke up this morning with this headache Chronicity: pt has had similar headaches in the past, family history of right sided headaches that are similar Location: right frontal, temporal and occipital Radiation: does not radiate Quality and severity: pressure Alleviating factors: pressure to the side of his head Worsening factors: phonophobia and photophobia Treatments tried: 400 mg Aleve  Associated Symptoms: low back pain Negative ROS: Confusion, diaphoresis, fever, weakness (general or focal), change of vision,  neck pain, dysphagia, aphagia, chest pain, shortness of breath, back pain, abdominal pains, nausea, vomiting, diarrhea, rash, bowel or urinary incontinence.  Past Medical History  Diagnosis Date  . Right nephrolithiasis   . History of kidney stones   . Acid reflux     WATCHES DIET  . Ureteral stenosis, right    Past Surgical History  Procedure Laterality Date  . Hernia repair  AGE 69    ABDOMINAL  . Cystoscopy with retrograde pyelogram, ureteroscopy and stent placement Right 03/31/2014    Procedure: CYSTOSCOPY WITH RETROGRADE PYELOGRAM, URETEROSCOPY AND STENT EXCHANGE, stone extraction;  Surgeon: Magdalene Molly, MD;  Location: [facility];  Service: Urology;  Laterality: Right;  . Holmium laser application Right  03/31/2014    Procedure: HOLMIUM LASER APPLICATION;  Surgeon: Magdalene Molly, MD;  Location: [facility];  Service: Urology;  Laterality: Right;  . Cystoscopy with retrograde pyelogram, ureteroscopy and stent placement Right 03/17/2014    Procedure: CYSTOSCOPY WITH RETROGRADE PYELOGRAM,  AND STENT PLACEMENT;  Surgeon: Magdalene Molly, MD;  Location: [facility];  Service: Urology;  Laterality: Right;   Family History  Problem Relation Age of Onset  . Kidney Stones Mother   . Kidney Stones Father   . Diabetes Father   . Hypertension Father    Social History  Substance Use Topics  . Smoking status: Current Some Day Smoker -- 16 years    Types: Cigarettes  . Smokeless tobacco: Never Used     Comment: SOCIAL SMOKER  . Alcohol Use: No    Review of Systems  10 Systems reviewed and are negative for acute change except as noted in the HPI.    Allergies  Review of patient's allergies indicates no known allergies.  Home Medications   Prior to Admission medications   Medication Sig Start Date End Date Taking? Authorizing Provider  naproxen sodium (ANAPROX) 220 MG tablet Take 440 mg by mouth every 12 (twelve) hours as needed (pain, headache).   Yes Historical Provider, MD  oxybutynin (DITROPAN) 5 MG tablet Take 1 tablet (5 mg total) by mouth every 6 (six) hours as needed for bladder spasms. Patient not taking: Reported on 06/29/2015 03/17/14   Natalia Leatherwood, MD  phenazopyridine (PYRIDIUM) 200 MG tablet Take 1 tablet (200 mg total) by mouth 3 (three) times daily as needed for pain. Patient not taking: Reported on 06/29/2015 03/17/14   Natalia Leatherwood,  MD   BP 116/74 mmHg  Pulse 63  Temp(Src) 97.5 F (36.4 C) (Oral)  Resp 17  SpO2 99% Physical Exam  Constitutional: He appears well-developed and well-nourished. No distress.  HENT:  Head: Normocephalic and atraumatic.  Eyes: Pupils are equal, round, and reactive to light.  Neck: Normal range of motion.  Neck supple. No spinous process tenderness and no muscular tenderness present.  Cardiovascular: Normal rate and regular rhythm.   Pulmonary/Chest: Effort normal.  Abdominal: Soft.  Neurological: He is alert.  Cranial nerves II-VIII and X-XII evaluated and show no deficits. Pt alert and oriented x 3 Upper and lower extremity strength is symmetrical and physiologic Normal muscular tone No facial droop Coordination intact, no limb ataxia  Skin: Skin is warm and dry.  Nursing note and vitals reviewed.  ED Course  Procedures (including critical care time) Labs Review Labs Reviewed - No data to display  Imaging Review No results found. I have personally reviewed and evaluated these images and lab results as part of my medical decision-making.   EKG Interpretation None      MDM   Final diagnoses:  Migraine with aura and without status migrainosus, not intractable   Presentation is non concerning for [facility], [facility], Meningitis, or temporal arteritis. Pt is afebrile with no focal neuro deficits, nuchal rigidity, or change in vision. The patient denies any symptoms of neurological impairment or TIA's; no amaurosis, diplopia, dysphasia, or unilateral disturbance of motor or sensory function. No loss of balance or vertigo.  Patient had complete relief with medications given in the ED. Hx of cluster headaches and strong family hx.  Medications  sodium chloride 0.9 % bolus 1,000 mL (0 mLs Intravenous Stopped 06/29/15 0919)  diphenhydrAMINE (BENADRYL) injection 25 mg (25 mg Intravenous Given 06/29/15 0813)  metoCLOPramide (REGLAN) injection 10 mg (10 mg Intravenous Given 06/29/15 0814)  ketorolac (TORADOL) 30 MG/ML injection 30 mg (30 mg Intravenous Given 06/29/15 0918)   38 y.o.Roberto Herrera's evaluation in the Emergency Department is complete. It has been determined that no acute conditions requiring further emergency intervention are present at this time. The patient/guardian have been advised  of the diagnosis and plan. We have discussed signs and symptoms that warrant return to the ED, such as changes or worsening in symptoms.  Vital signs are stable at discharge. Filed Vitals:   06/29/15 0911  BP: 116/74  Pulse: 63  Temp: 97.5 F (36.4 C)  Resp: 17    Patient/guardian has voiced understanding and agreed to follow-up with the PCP or specialist.     Marlon Pel, PA-C 06/29/15 1610  Lorre Nick, MD 07/01/15 [id number]

## 2015-06-29 NOTE — ED Notes (Signed)
Pt states he has a migraine headache  Pt states it started about 3am  Pt states pain is mainly on the right side  Denies N/V

## 2017-08-18 ENCOUNTER — Encounter (HOSPITAL_COMMUNITY): Payer: Self-pay

## 2017-08-18 ENCOUNTER — Emergency Department (HOSPITAL_COMMUNITY): Payer: Self-pay

## 2017-08-18 ENCOUNTER — Emergency Department (HOSPITAL_COMMUNITY)
Admission: EM | Admit: 2017-08-18 | Discharge: 2017-08-18 | Disposition: A | Discharge status: Home / Self Care | Payer: Self-pay | Attending: Emergency Medicine | Admitting: Emergency Medicine

## 2017-08-18 DIAGNOSIS — N200 Calculus of kidney: Secondary | ICD-10-CM | POA: Insufficient documentation

## 2017-08-18 DIAGNOSIS — F17210 Nicotine dependence, cigarettes, uncomplicated: Secondary | ICD-10-CM | POA: Insufficient documentation

## 2017-08-18 LAB — CBC WITH DIFFERENTIAL/PLATELET
BASOS ABS: 0.1 10*3/uL (ref 0.0–0.1)
Basophils Relative: 1 %
EOS PCT: 2 %
Eosinophils Absolute: 0.3 10*3/uL (ref 0.0–0.7)
HCT: 42.7 % (ref 39.0–52.0)
Hemoglobin: 14.6 g/dL (ref 13.0–17.0)
Lymphocytes Relative: 14 %
Lymphs Abs: 2.4 10*3/uL (ref 0.7–4.0)
MCH: 31.3 pg (ref 26.0–34.0)
MCHC: 34.2 g/dL (ref 30.0–36.0)
MCV: 91.4 fL (ref 78.0–100.0)
MONOS PCT: 5 %
Monocytes Absolute: 0.9 10*3/uL (ref 0.1–1.0)
Neutro Abs: 13.1 10*3/uL — ABNORMAL HIGH (ref 1.7–7.7)
Neutrophils Relative %: 78 %
PLATELETS: 320 10*3/uL (ref 150–400)
RBC: 4.67 MIL/uL (ref 4.22–5.81)
RDW: 13.4 % (ref 11.5–15.5)
WBC: 16.9 10*3/uL — ABNORMAL HIGH (ref 4.0–10.5)

## 2017-08-18 LAB — I-STAT CHEM 8, ED
BUN: 13 mg/dL (ref 6–20)
CALCIUM ION: 1.17 mmol/L (ref 1.15–1.40)
CREATININE: 1 mg/dL (ref 0.61–1.24)
Chloride: 106 mmol/L (ref 101–111)
Glucose, Bld: 154 mg/dL — ABNORMAL HIGH (ref 65–99)
HCT: 44 % (ref 39.0–52.0)
HEMOGLOBIN: 15 g/dL (ref 13.0–17.0)
POTASSIUM: 3.4 mmol/L — AB (ref 3.5–5.1)
Sodium: 142 mmol/L (ref 135–145)
TCO2: 24 mmol/L (ref 22–32)

## 2017-08-18 LAB — URINALYSIS, ROUTINE W REFLEX MICROSCOPIC
BACTERIA UA: NONE SEEN
Bilirubin Urine: NEGATIVE
GLUCOSE, UA: NEGATIVE mg/dL
Ketones, ur: NEGATIVE mg/dL
Leukocytes, UA: NEGATIVE
NITRITE: NEGATIVE
PH: 5 (ref 5.0–8.0)
PROTEIN: NEGATIVE mg/dL
Specific Gravity, Urine: 1.023 (ref 1.005–1.030)

## 2017-08-18 LAB — HEPATIC FUNCTION PANEL
ALT: 28 U/L (ref 17–63)
AST: 27 U/L (ref 15–41)
Albumin: 4.4 g/dL (ref 3.5–5.0)
Alkaline Phosphatase: 62 U/L (ref 38–126)
Total Bilirubin: 0.4 mg/dL (ref 0.3–1.2)
Total Protein: 7.5 g/dL (ref 6.5–8.1)

## 2017-08-18 LAB — LIPASE, BLOOD: LIPASE: 39 U/L (ref 11–51)

## 2017-08-18 MED ORDER — HYDROMORPHONE HCL 1 MG/ML IJ SOLN
1.0000 mg | Freq: Once | INTRAMUSCULAR | Status: AC
  Administered 2017-08-18: 1 mg via INTRAVENOUS
  Filled 2017-08-18: qty 1

## 2017-08-18 MED ORDER — SODIUM CHLORIDE 0.9 % IV BOLUS (SEPSIS)
1000.0000 mL | Freq: Once | INTRAVENOUS | Status: AC
Start: 2017-08-18 — End: 2017-08-18
  Administered 2017-08-18: 1000 mL via INTRAVENOUS

## 2017-08-18 MED ORDER — IBUPROFEN 600 MG PO TABS: 600.0000 mg | ORAL_TABLET | Freq: Four times a day (QID) | ORAL | 0 refills | Status: AC | PRN

## 2017-08-18 MED ORDER — KETOROLAC TROMETHAMINE 30 MG/ML IJ SOLN
30.0000 mg | Freq: Once | INTRAMUSCULAR | Status: AC
  Administered 2017-08-18: 30 mg via INTRAVENOUS
  Filled 2017-08-18: qty 1

## 2017-08-18 MED ORDER — ONDANSETRON HCL 4 MG PO TABS: 4.0000 mg | ORAL_TABLET | Freq: Three times a day (TID) | ORAL | 0 refills | Status: AC | PRN

## 2017-08-18 MED ORDER — ONDANSETRON HCL 4 MG/2ML IJ SOLN
4.0000 mg | Freq: Once | INTRAMUSCULAR | Status: AC
  Administered 2017-08-18: 4 mg via INTRAVENOUS
  Filled 2017-08-18: qty 2

## 2017-08-18 NOTE — ED Notes (Signed)
Pt stated, "I may have kidney stones, and I don't feel like answering any more questions." Diaphoretic

## 2017-08-18 NOTE — ED Provider Notes (Signed)
[facility]-EMERGENCY DEPT Provider Note   CSN: [id number] Arrival date & time: 08/18/17  1913     History   Chief Complaint Chief Complaint  Patient presents with  . Flank Pain    HPI Roberto Herrera is a 41 y.o. male.  HPI  41 year old male with history of kidney stones in the past requiring ureteral stenting resenting for evaluation of acute onset of left flank pain. Patient developed pain to his left flank several hours ago while he was walking. Pain is intense, sharp, shooting, with associate nausea, vomiting and feeling lightheaded. Pain feels similar to prior kidney stones. History is limited as patient is very comfortable. No specific treatment tried. He denies having any dysuria or hematuria. No complaints of chest pain or short of breath. Denies any testicle pain or scrotal pain.   Past Medical History:  Diagnosis Date  . Acid reflux    WATCHES DIET  . History of kidney stones   . Right nephrolithiasis   . Ureteral stenosis, right     There are no active problems to display for this patient.   Past Surgical History:  Procedure Laterality Date  . CYSTOSCOPY WITH RETROGRADE PYELOGRAM, URETEROSCOPY AND STENT PLACEMENT Right 03/31/2014   Procedure: CYSTOSCOPY WITH RETROGRADE PYELOGRAM, URETEROSCOPY AND STENT EXCHANGE, stone extraction;  Surgeon: Magdalene Mollyaniel Y Woodruff, MD;  [facility] [place];  Service: Urology;  Laterality: Right;  . CYSTOSCOPY WITH RETROGRADE PYELOGRAM, URETEROSCOPY AND STENT PLACEMENT Right 03/17/2014   Procedure: CYSTOSCOPY WITH RETROGRADE PYELOGRAM,  AND STENT PLACEMENT;  Surgeon: Magdalene Mollyaniel Y Woodruff, MD;  [facility] [place];  Service: Urology;  Laterality: Right;  . HERNIA REPAIR  AGE 45   ABDOMINAL  . HOLMIUM LASER APPLICATION Right 03/31/2014   Procedure: HOLMIUM LASER APPLICATION;  Surgeon: Magdalene Mollyaniel Y Woodruff, MD;  [facility] [place];  Service: Urology;  Laterality: Right;        Home Medications    Prior to Admission medications   Not on File    Family History Family History  Problem Relation Age of Onset  . Kidney Stones Mother   . Kidney Stones Father   . Diabetes Father   . Hypertension Father     Social History Social History  Substance Use Topics  . Smoking status: Current Some Day Smoker    Years: 16.00    Types: Cigarettes  . Smokeless tobacco: Never Used     Comment: SOCIAL SMOKER  . Alcohol use No     Allergies   Erythromycin   Review of Systems Review of Systems  All other systems reviewed and are negative.    Physical Exam Updated Vital Signs BP (!) 157/91 (BP Location: Left Arm)   Pulse 66   Temp 97.6 F (36.4 C) (Oral)   Resp 15   Ht 5\' 10"  (1.778 m)   Wt 94.8 kg (209 lb)   SpO2 96%   BMI 29.99 kg/m   Physical Exam  Constitutional: He appears well-developed and well-nourished. No distress (appears uncomfortable, diaphoretic, actively dry heaving. ).  HENT:  Head: Atraumatic.  Eyes: Conjunctivae are normal.  Neck: Neck supple.  Cardiovascular: Normal rate and regular rhythm.   Pulmonary/Chest: Effort normal and breath sounds normal.  Abdominal: Soft. Bowel sounds are normal. He exhibits no distension. There is no tenderness.  Genitourinary:  Genitourinary Comments: No cva tenderness  Neurological: He is alert.  Skin: No rash noted.  Psychiatric: He has a normal mood and affect.  Nursing note  and vitals reviewed.    ED Treatments / Results  Labs (all labs ordered are listed, but only abnormal results are displayed) Labs Reviewed  URINALYSIS, ROUTINE W REFLEX MICROSCOPIC - Abnormal; Notable for the following:       Result Value   APPearance HAZY (*)    Hgb urine dipstick MODERATE (*)    Squamous Epithelial / LPF 0-5 (*)    Crystals PRESENT (*)    All other components within normal limits  CBC WITH DIFFERENTIAL/PLATELET - Abnormal; Notable for the following:    WBC 16.9 (*)    Neutro Abs  13.1 (*)    All other components within normal limits  HEPATIC FUNCTION PANEL - Abnormal; Notable for the following:    Bilirubin, Direct <0.1 (*)    All other components within normal limits  I-STAT CHEM 8, ED - Abnormal; Notable for the following:    Potassium 3.4 (*)    Glucose, Bld 154 (*)    All other components within normal limits  LIPASE, BLOOD    EKG  EKG Interpretation None       Radiology Ct Renal Stone Study  Result Date: 08/18/2017 CLINICAL DATA:  Left flank pain with history of stones. EXAM: CT ABDOMEN AND PELVIS WITHOUT CONTRAST TECHNIQUE: Multidetector CT imaging of the abdomen and pelvis was performed following the standard protocol without IV contrast. COMPARISON:  02/25/2014 FINDINGS: Lower chest: Clear lung bases. Normal heart size without pericardial or pleural effusion. Tiny hiatal hernia. Hepatobiliary: Subcapsular left hepatic lobe cyst. Normal gallbladder, without biliary ductal dilatation. Pancreas: Normal, without mass or ductal dilatation. Spleen: Normal in size, without focal abnormality. Adrenals/Urinary Tract: Normal adrenal glands. Possible punctate right renal collecting system calculi, most apparent on sagittal reformats. Mild left-sided hydroureteronephrosis. Hydroureter continues to the level of a 2 mm stone at the left ureterovesicular junction on image 84/series 2. Stomach/Bowel: Normal remainder of the stomach. Decompressed distal colon. Normal terminal ileum and appendix. Small bowel loops measure maximally 3.4 cm on image 46/series 2. Vascular/Lymphatic: Normal caliber of the aorta and branch vessels. No abdominopelvic adenopathy. Reproductive: Normal prostate. Other: No significant free fluid. Musculoskeletal: Degenerative disc disease at the lumbosacral junction. Prominent disc bulge at this level. IMPRESSION: 2 mm stone at the left ureterovesicular junction, with mild proximal hydroureteronephrosis. Possible right nephrolithiasis. Minimal mid small  bowel dilatation, which could represent a secondary mild adynamic ileus. Electronically Signed   By: Jeronimo Greaves M.D.   On: 08/18/2017 21:53    Procedures Procedures (including critical care time)  Medications Ordered in ED Medications  ketorolac (TORADOL) 30 MG/ML injection 30 mg (not administered)  ondansetron (ZOFRAN) injection 4 mg (not administered)     Initial Impression / Assessment and Plan / ED Course  I have reviewed the triage vital signs and the nursing notes.  Pertinent labs & imaging results that were available during my care of the patient were reviewed by me and considered in my medical decision making (see chart for details).     BP 120/68 (BP Location: Left Arm)   Pulse 72   Temp 97.6 F (36.4 C) (Oral)   Resp 20   Ht 5\' 10"  (1.778 m)   Wt 94.8 kg (209 lb)   SpO2 97%   BMI 29.99 kg/m    Final Clinical Impressions(s) / ED Diagnoses   Final diagnoses:  Nephrolithiasis    New Prescriptions New Prescriptions   IBUPROFEN (ADVIL,MOTRIN) 600 MG TABLET    Take 1 tablet (600 mg total) by mouth  every 6 (six) hours as needed.   ONDANSETRON (ZOFRAN) 4 MG TABLET    Take 1 tablet (4 mg total) by mouth every 8 (eight) hours as needed for nausea or vomiting.   8:00 PM Patient with history of kidney stones here with left flank pain. He appears to be very uncomfortable. Symptoms suggestive of potential kidney stones. Will provide symptomatic treatment and will obtain renal CT stone study for further evaluation. Prompt pain management of his sxs.   10:17 PM CT scan demonstrated a 2 mm stone at the left UVJ with mild proximal hydroureter nephrosis. Minimal mid small bowel dilatation which could represent a secondary mild adynamic ileus. Patient does have an elevated white count of 16 likely reactive.  Patient felt much better currently. He is resting comfortably. UA without evidence of UTI.  Pt stable for discharge.  outpt referral to urology as needed.  Return  precaution given.    Fayrene Helper, PA-C 08/18/17 2235    Rolland Porter, MD 09/06/17 [phone]

## 2017-08-18 NOTE — Discharge Instructions (Signed)
You have a 2mm kidney stone on the left side.  You should be able to pass this stone.  Followup with urologist as needed.  Take pain medication and antinausea medication as needed.  Return if you have any concerns.

## 2017-08-18 NOTE — ED Notes (Signed)
Pt states that he is unable to urinate and has been unable to all day.

## 2017-08-18 NOTE — ED Triage Notes (Signed)
States right flank pain since 1800p with severe pain voiced and screaming in triage.

## 2019-11-20 ENCOUNTER — Other Ambulatory Visit: Payer: Self-pay

## 2019-11-20 DIAGNOSIS — N202 Calculus of kidney with calculus of ureter: Secondary | ICD-10-CM | POA: Insufficient documentation

## 2019-11-20 DIAGNOSIS — F17210 Nicotine dependence, cigarettes, uncomplicated: Secondary | ICD-10-CM | POA: Insufficient documentation

## 2019-11-20 NOTE — ED Triage Notes (Signed)
Pt presents to ED via POV c/o flank pain, penile pain and urinary frequency but little to no amount. Hx of same.

## 2019-11-21 ENCOUNTER — Emergency Department (HOSPITAL_COMMUNITY): Payer: Self-pay

## 2019-11-21 ENCOUNTER — Emergency Department (HOSPITAL_COMMUNITY)
Admission: EM | Admit: 2019-11-21 | Discharge: 2019-11-21 | Disposition: A | Discharge status: Home / Self Care | Payer: Self-pay | Attending: Emergency Medicine | Admitting: Emergency Medicine

## 2019-11-21 DIAGNOSIS — N200 Calculus of kidney: Secondary | ICD-10-CM

## 2019-11-21 LAB — CBC
HCT: 46.6 % (ref 39.0–52.0)
Hemoglobin: 15.7 g/dL (ref 13.0–17.0)
MCH: 31.1 pg (ref 26.0–34.0)
MCHC: 33.7 g/dL (ref 30.0–36.0)
MCV: 92.3 fL (ref 80.0–100.0)
Platelets: 319 10*3/uL (ref 150–400)
RBC: 5.05 MIL/uL (ref 4.22–5.81)
RDW: 12.6 % (ref 11.5–15.5)
WBC: 16.4 10*3/uL — ABNORMAL HIGH (ref 4.0–10.5)
nRBC: 0 % (ref 0.0–0.2)

## 2019-11-21 LAB — COMPREHENSIVE METABOLIC PANEL
ALT: 31 U/L (ref 0–44)
AST: 24 U/L (ref 15–41)
Albumin: 5.1 g/dL — ABNORMAL HIGH (ref 3.5–5.0)
Alkaline Phosphatase: 72 U/L (ref 38–126)
Anion gap: 14 (ref 5–15)
BUN: 19 mg/dL (ref 6–20)
CO2: 21 mmol/L — ABNORMAL LOW (ref 22–32)
Calcium: 9.9 mg/dL (ref 8.9–10.3)
Chloride: 104 mmol/L (ref 98–111)
Creatinine, Ser: 1 mg/dL (ref 0.61–1.24)
GFR calc Af Amer: >60 mL/min (ref >60)
GFR calc non Af Amer: >60 mL/min (ref >60)
Glucose, Bld: 124 mg/dL — ABNORMAL HIGH (ref 70–99)
Potassium: 4 mmol/L (ref 3.5–5.1)
Sodium: 139 mmol/L (ref 135–145)
Total Bilirubin: 1.1 mg/dL (ref 0.3–1.2)
Total Protein: 8.3 g/dL — ABNORMAL HIGH (ref 6.5–8.1)

## 2019-11-21 LAB — URINALYSIS, ROUTINE W REFLEX MICROSCOPIC
Bilirubin Urine: NEGATIVE
Glucose, UA: NEGATIVE mg/dL
Ketones, ur: 80 mg/dL — AB
Leukocytes,Ua: NEGATIVE
Nitrite: NEGATIVE
Protein, ur: 100 mg/dL — AB
RBC / HPF: >50 RBC/hpf — ABNORMAL HIGH (ref 0–5)
Specific Gravity, Urine: 1.031 — ABNORMAL HIGH (ref 1.005–1.030)
pH: 6 (ref 5.0–8.0)

## 2019-11-21 MED ORDER — PROMETHAZINE HCL 25 MG/ML IJ SOLN: 25.0000 mg | Freq: Once | INTRAMUSCULAR | Status: DC

## 2019-11-21 MED ORDER — OXYCODONE HCL 5 MG PO TABS: 5.0000 mg | ORAL_TABLET | ORAL | 0 refills | Status: AC | PRN

## 2019-11-21 MED ORDER — ONDANSETRON 4 MG PO TBDP
4.0000 mg | ORAL_TABLET | Freq: Once | ORAL | Status: AC
  Administered 2019-11-21: 02:00:00 4 mg via ORAL
  Filled 2019-11-21: qty 1

## 2019-11-21 MED ORDER — OXYCODONE HCL 5 MG PO TABS
10.0000 mg | ORAL_TABLET | Freq: Once | ORAL | Status: AC
  Administered 2019-11-21: 10 mg via ORAL
  Filled 2019-11-21: qty 2

## 2019-11-21 NOTE — ED Provider Notes (Signed)
[facility] Emergency Department Provider Note MRN:  [id number]  Arrival date & time: 11/21/19     Chief Complaint   Abdominal pain History of Present Illness   Roberto Herrera is a 44 y.o. year-old male with a history of kidney stones presenting to the ED with chief complaint of abdominal pain.  Sudden onset left lower quadrant abdominal pain today at 3:30 PM.  He thinks it is a kidney stone but it is different because normally the pain is in his flanks.  Denies fever, no cough, no chest pain, no shortness of breath.  Endorsing hematuria and some urinary frequency.  Pain is currently moderate to severe, constant, no exacerbating or alleviating factors.  Review of Systems  A complete 10 system review of systems was obtained and all systems are negative except as noted in the HPI and PMH.   Patient's Health History    Past Medical History:  Diagnosis Date  . Acid reflux    WATCHES DIET  . History of kidney stones   . Right nephrolithiasis   . Ureteral stenosis, right     Past Surgical History:  Procedure Laterality Date  . CYSTOSCOPY WITH RETROGRADE PYELOGRAM, URETEROSCOPY AND STENT PLACEMENT Right 03/31/2014   Procedure: CYSTOSCOPY WITH RETROGRADE PYELOGRAM, URETEROSCOPY AND STENT EXCHANGE, stone extraction;  Surgeon: Sharyn Creamer, MD;  Location: [facility];  Service: Urology;  Laterality: Right;  . CYSTOSCOPY WITH RETROGRADE PYELOGRAM, URETEROSCOPY AND STENT PLACEMENT Right 03/17/2014   Procedure: CYSTOSCOPY WITH RETROGRADE PYELOGRAM,  AND STENT PLACEMENT;  Surgeon: Sharyn Creamer, MD;  Location: [facility];  Service: Urology;  Laterality: Right;  . HERNIA REPAIR  AGE 32   ABDOMINAL  . HOLMIUM LASER APPLICATION Right 07/01/7901   Procedure: HOLMIUM LASER APPLICATION;  Surgeon: Sharyn Creamer, MD;  Location: [facility];  Service: Urology;  Laterality: Right;    Family History  Problem Relation Age of  Onset  . Kidney Stones Mother   . Kidney Stones Father   . Diabetes Father   . Hypertension Father     Social History   Socioeconomic History  . Marital status: Single    Spouse name: Not on file  . Number of children: Not on file  . Years of education: Not on file  . Highest education level: Not on file  Occupational History  . Not on file  Tobacco Use  . Smoking status: Current Some Day Smoker    Years: 16.00    Types: Cigarettes  . Smokeless tobacco: Never Used  . Tobacco comment: SOCIAL SMOKER  Substance and Sexual Activity  . Alcohol use: No  . Drug use: No  . Sexual activity: Not on file  Other Topics Concern  . Not on file  Social History Narrative  . Not on file   Social Determinants of Health   Financial Resource Strain:   . Difficulty of Paying Living Expenses: Not on file  Food Insecurity:   . Worried About Charity fundraiser in the Last Year: Not on file  . Ran Out of Food in the Last Year: Not on file  Transportation Needs:   . Lack of Transportation (Medical): Not on file  . Lack of Transportation (Non-Medical): Not on file  Physical Activity:   . Days of Exercise per Week: Not on file  . Minutes of Exercise per Session: Not on file  Stress:   . Feeling of Stress : Not on file  Social Connections:   .  Frequency of Communication with Friends and Family: Not on file  . Frequency of Social Gatherings with Friends and Family: Not on file  . Attends Religious Services: Not on file  . Active Member of Clubs or Organizations: Not on file  . Attends Banker Meetings: Not on file  . Marital Status: Not on file  Intimate Partner Violence:   . Fear of Current or Ex-Partner: Not on file  . Emotionally Abused: Not on file  . Physically Abused: Not on file  . Sexually Abused: Not on file     Physical Exam   Vitals:   11/20/19 2205 11/21/19 0109  BP: 120/89 124/86  Pulse: 78 70  Resp: 18 16  Temp: 98.4 F (36.9 C)   SpO2: 98% 99%      CONSTITUTIONAL: Well-appearing, NAD NEURO:  Alert and oriented x 3, no focal deficits EYES:  eyes equal and reactive ENT/NECK:  no LAD, no JVD CARDIO: Regular rate, well-perfused, normal S1 and S2 PULM:  CTAB no wheezing or rhonchi GI/GU:  normal bowel sounds, non-distended, non-tender MSK/SPINE:  No gross deformities, no edema SKIN:  no rash, atraumatic PSYCH:  Appropriate speech and behavior  *Additional and/or pertinent findings included in MDM below  Diagnostic and Interventional Summary    EKG Interpretation  Date/Time:    Ventricular Rate:    PR Interval:    QRS Duration:   QT Interval:    QTC Calculation:   R Axis:     Text Interpretation:        Labs Reviewed  URINALYSIS, ROUTINE W REFLEX MICROSCOPIC - Abnormal; Notable for the following components:      Result Value   Color, Urine AMBER (*)    APPearance HAZY (*)    Specific Gravity, Urine 1.031 (*)    Hgb urine dipstick LARGE (*)    Ketones, ur 80 (*)    Protein, ur 100 (*)    RBC / HPF >50 (*)    Bacteria, UA RARE (*)    All other components within normal limits  CBC - Abnormal; Notable for the following components:   WBC 16.4 (*)    All other components within normal limits  COMPREHENSIVE METABOLIC PANEL - Abnormal; Notable for the following components:   CO2 21 (*)    Glucose, Bld 124 (*)    Total Protein 8.3 (*)    Albumin 5.1 (*)    All other components within normal limits    CT RENAL STONE STUDY  Final Result      Medications  promethazine (PHENERGAN) injection 25 mg (has no administration in time range)  oxyCODONE (Oxy IR/ROXICODONE) immediate release tablet 10 mg (10 mg Oral Given 11/21/19 0138)  ondansetron (ZOFRAN-ODT) disintegrating tablet 4 mg (4 mg Oral Given 11/21/19 0150)     Procedures  /  Critical Care Procedures  ED Course and Medical Decision Making  I have reviewed the triage vital signs, the nursing notes, and pertinent available records from the EMR.  Pertinent labs &  imaging results that were available during my care of the patient were reviewed by me and considered in my medical decision making (see below for details).     Suspect recurrent kidney stone, however patient has had large kidney stones that needed removal, history of ureteral stenosis.  There is also some diagnostic uncertainty given the left lower quadrant pain.  Will obtain CT to evaluate.  Labs reassuring, no evidence of infection in the urine, normal kidney function, CT confirms  small nonobstructing stone.  Patient's pain is well controlled, appropriate for discharge.  Elmer Sow. Pilar Plate, MD [facility] [email or web address]  Final Clinical Impressions(s) / ED Diagnoses     ICD-10-CM   1. Kidney stone  N20.0     ED Discharge Orders         Ordered    oxyCODONE (ROXICODONE) 5 MG immediate release tablet  Every 4 hours PRN     11/21/19 0417           Discharge Instructions Discussed with and Provided to Patient:     Discharge Instructions     You were evaluated in the Emergency Department and after careful evaluation, we did not find any emergent condition requiring admission or further testing in the hospital.  Your exam/testing today was overall reassuring.  Your symptoms seem to be due to a kidney stone.  Please use Tylenol or Motrin at home for pain.  For more significant pain you can use the oxycodone medication provided.  Please follow-up with your urologist.  Please return to the Emergency Department if you experience any worsening of your condition.  We encourage you to follow up with a primary care provider.  Thank you for allowing [place] to be a part of your care.        Sabas Sous, MD 11/21/19 [id number]

## 2019-11-21 NOTE — ED Notes (Signed)
Pt declined dc vitals.

## 2019-11-21 NOTE — Discharge Instructions (Addendum)
You were evaluated in the Emergency Department and after careful evaluation, we did not find any emergent condition requiring admission or further testing in the hospital.  Your exam/testing today was overall reassuring.  Your symptoms seem to be due to a kidney stone.  Please use Tylenol or Motrin at home for pain.  For more significant pain you can use the oxycodone medication provided.  Please follow-up with your urologist.  Please return to the Emergency Department if you experience any worsening of your condition.  We encourage you to follow up with a primary care provider.  Thank you for allowing [place] to be a part of your care.

## 2019-11-21 NOTE — ED Notes (Signed)
Pt did not provide enough urine to send a urine culture.

## 2020-10-06 ENCOUNTER — Ambulatory Visit: Payer: Self-pay

## 2020-12-19 IMAGING — CT CT RENAL STONE PROTOCOL
2 of 4 series · 15 of 46 positions shown, 17 images · non-contrast
Comparison: 08/18/2017

CLINICAL DATA: Flank pain.

EXAM:
CT ABDOMEN AND PELVIS WITHOUT CONTRAST
TECHNIQUE: Multidetector CT imaging of the abdomen and pelvis was performed
following the standard protocol without IV contrast.

[Series 2: axial st · axial · 0.74mm/px · z∈[-562,-97]mm · 12 of 107 slices shown, 14 images]
[im 7/107  soft-tissue]
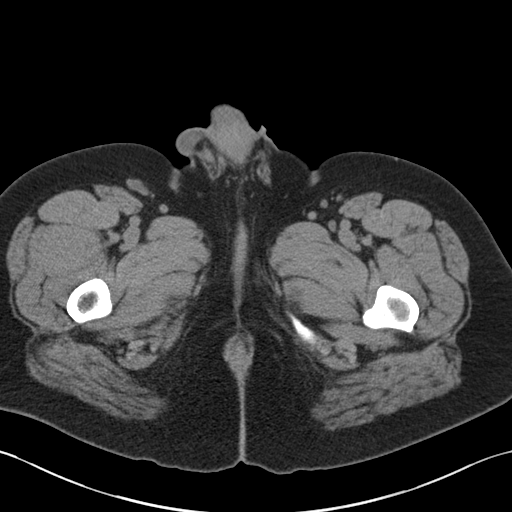
[im 7/107  bone]
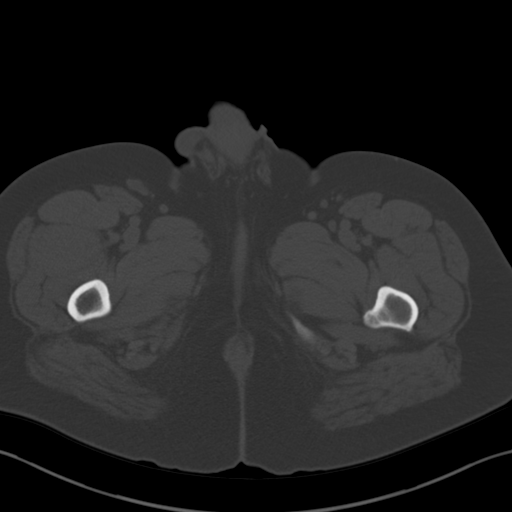
[im 19/107  soft-tissue]
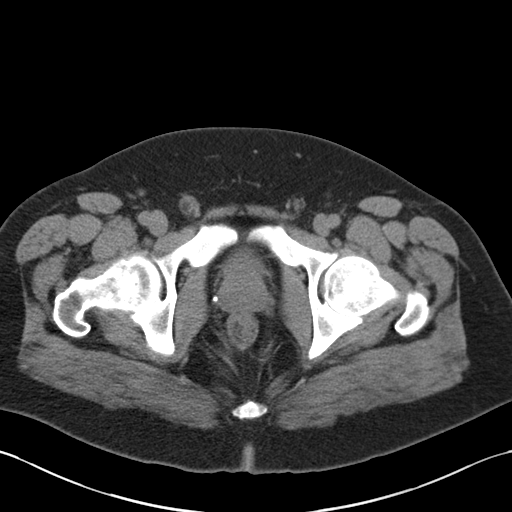
[im 25/107  soft-tissue]
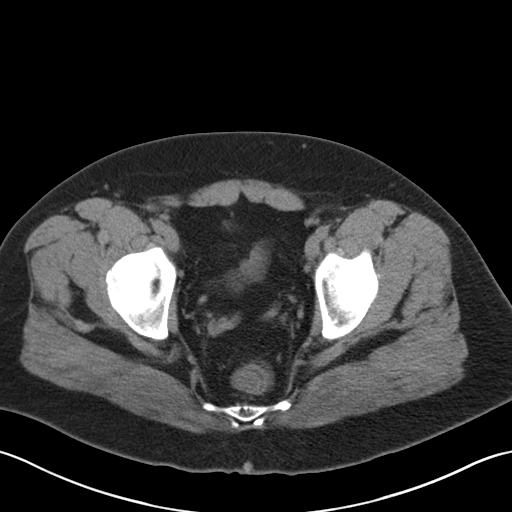
[im 32/107  soft-tissue]
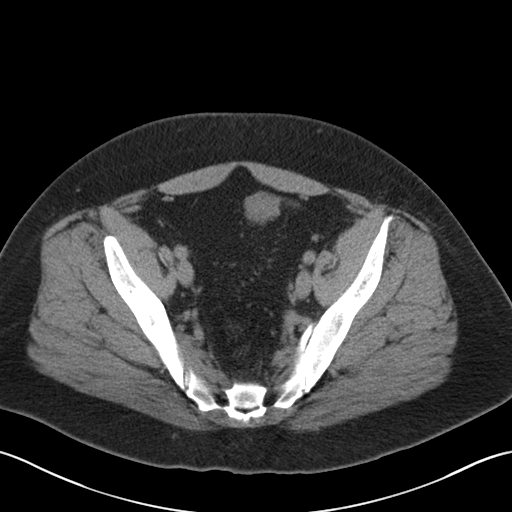
[im 44/107  soft-tissue]
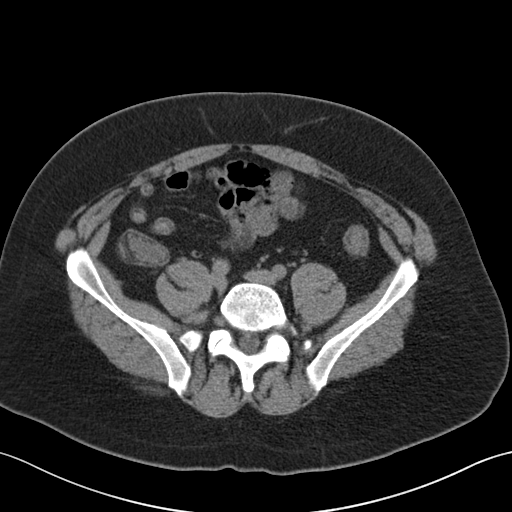
[im 50/107  soft-tissue]
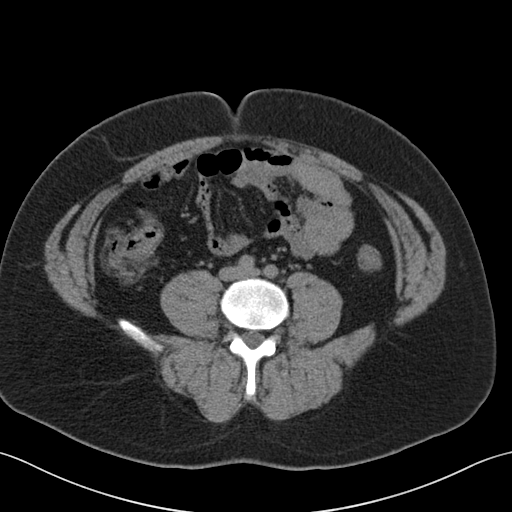
[im 57/107  soft-tissue]
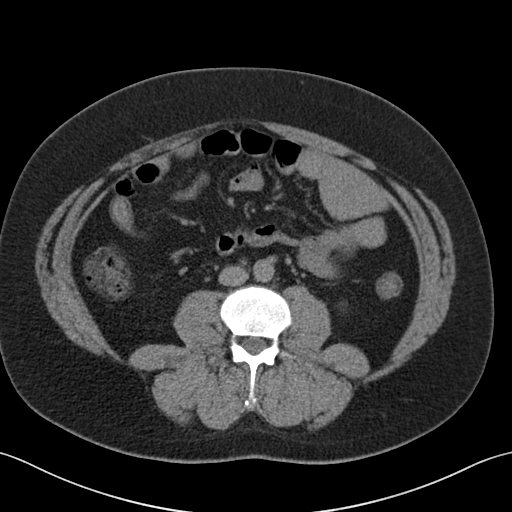
[im 69/107  soft-tissue]
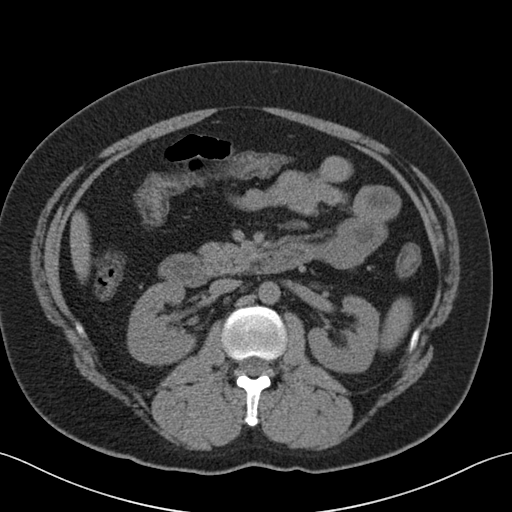
[im 75/107  soft-tissue]
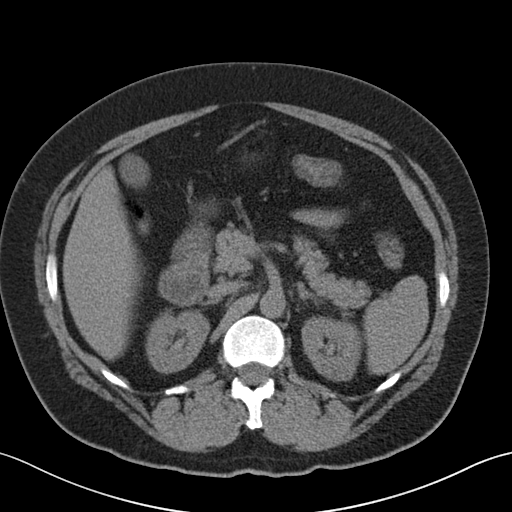
[im 75/107  bone]
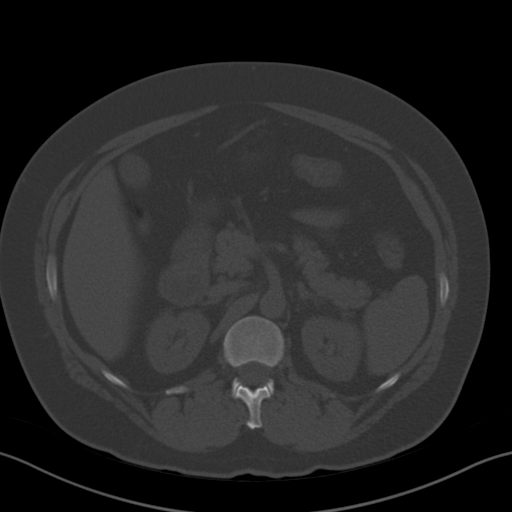
[im 82/107  soft-tissue]
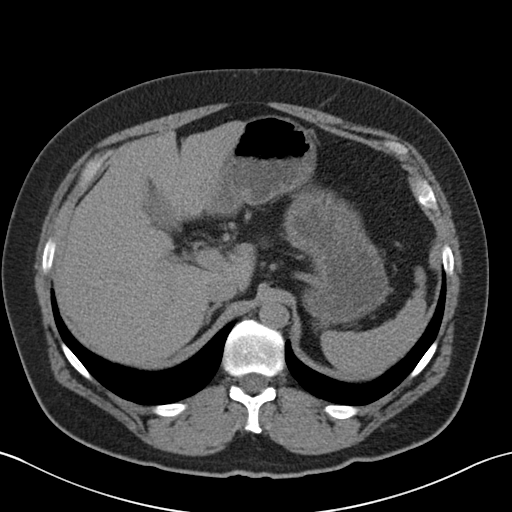
[im 94/107  soft-tissue]
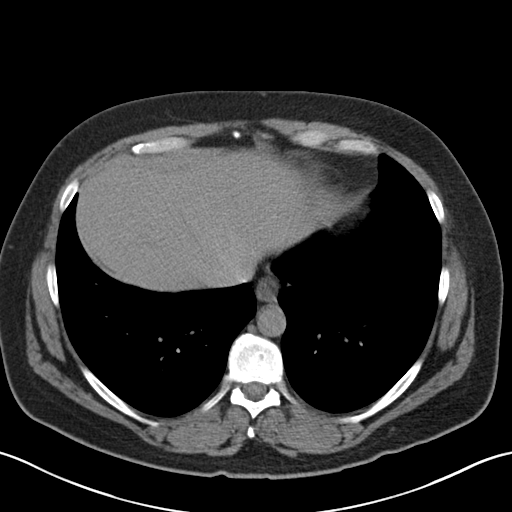
[im 100/107  soft-tissue]
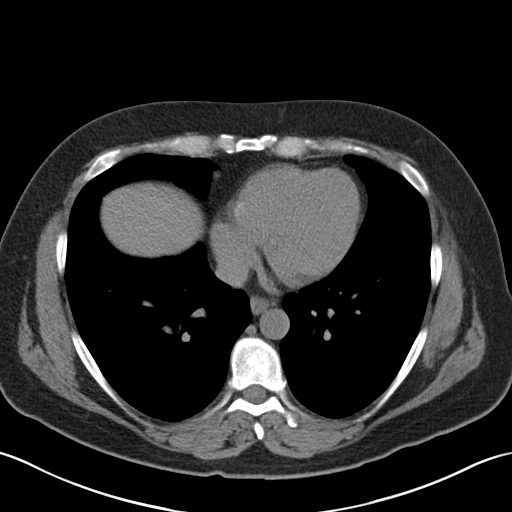

[Series 5: coronal · coronal · 0.78mm/px · 3 of 151 slices shown]
[im 51/151  soft-tissue]
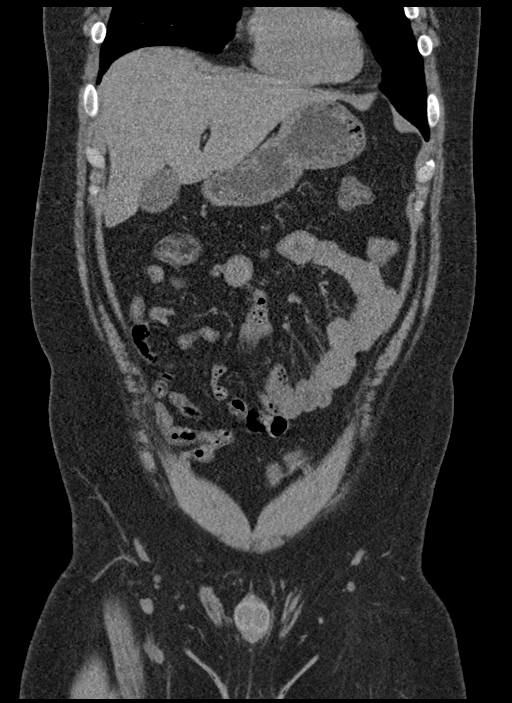
[im 67/151  soft-tissue]
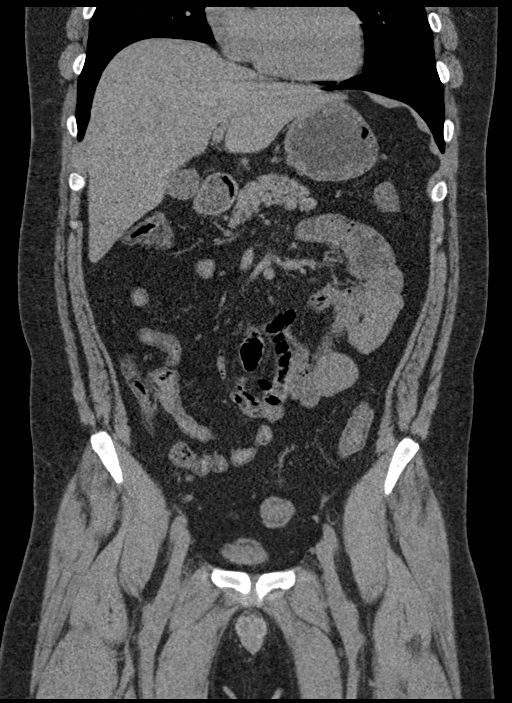
[im 84/151  soft-tissue]
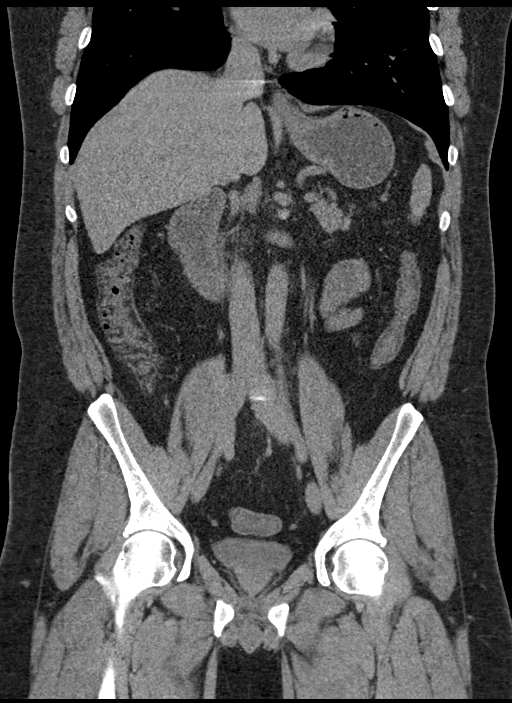

[15 of 46 positions shown; findings below may reference images not displayed]

FINDINGS: Lower chest: Lung bases are clear.

Hepatobiliary: Stable 10 mm cyst adjacent to the falciform ligament.
Tiny hypodensity in the right lobe is too small to accurately
characterize. No gallstones, gallbladder wall thickening, or biliary
dilatation.

Pancreas: No ductal dilatation or inflammation.

Spleen: Normal in size without focal abnormality.

Adrenals/Urinary Tract: Normal adrenal glands. 4 mm stone at the
left ureterovesicular junction, however no hydroureteronephrosis. No
perinephric edema. The left ureter is decompressed. Punctate
nonobstructing stone in the lower right kidney. No right
hydronephrosis or hydroureter. Right ureter is decompressed. Urinary
bladder is partially distended.

Stomach/Bowel: Tiny hiatal hernia. Ingested material in the stomach.
No small bowel obstruction or inflammation. Normal appendix. Mild
submucosal fatty infiltration of the ascending and transverse colon.
No colonic wall thickening or acute inflammation. Minimal stool in
the colon.

Vascular/Lymphatic: Abdominal aorta is normal in caliber. Few
prominent upper abdominal a periportal lymph nodes are likely
reactive. Pelvic adenopathy.

Reproductive: Prostate is unremarkable.

Other: No free air, free fluid, or intra-abdominal fluid collection.

Musculoskeletal: There are no acute or suspicious osseous
abnormalities. Bone island in the right proximal femur. Degenerative
disc disease at L5-S1.
IMPRESSION: 1. Left ureterovesicular 4 mm stone, however no obstructive change.
No hydronephrosis or perinephric edema.
2. Punctate nonobstructing stone in the lower right kidney.
